# Patient Record
Sex: Female | Born: 1980 | Race: White | Hispanic: No | Marital: Married | Smoking: Former smoker
Health system: Southern US, Community
[De-identification: ages and names within clinical notes are randomized; demographics above are authoritative.]

## PROBLEM LIST (undated history)

## (undated) DIAGNOSIS — R51 Headache: Secondary | ICD-10-CM

## (undated) DIAGNOSIS — Z9889 Other specified postprocedural states: Secondary | ICD-10-CM

## (undated) DIAGNOSIS — H9190 Unspecified hearing loss, unspecified ear: Secondary | ICD-10-CM

## (undated) DIAGNOSIS — N924 Excessive bleeding in the premenopausal period: Secondary | ICD-10-CM

## (undated) DIAGNOSIS — E039 Hypothyroidism, unspecified: Secondary | ICD-10-CM

## (undated) DIAGNOSIS — R3989 Other symptoms and signs involving the genitourinary system: Secondary | ICD-10-CM

## (undated) DIAGNOSIS — O139 Gestational [pregnancy-induced] hypertension without significant proteinuria, unspecified trimester: Secondary | ICD-10-CM

## (undated) DIAGNOSIS — N943 Premenstrual tension syndrome: Secondary | ICD-10-CM

## (undated) DIAGNOSIS — R112 Nausea with vomiting, unspecified: Secondary | ICD-10-CM

## (undated) DIAGNOSIS — Z8659 Personal history of other mental and behavioral disorders: Secondary | ICD-10-CM

## (undated) DIAGNOSIS — J301 Allergic rhinitis due to pollen: Secondary | ICD-10-CM

## (undated) DIAGNOSIS — K8020 Calculus of gallbladder without cholecystitis without obstruction: Secondary | ICD-10-CM

## (undated) DIAGNOSIS — K921 Melena: Secondary | ICD-10-CM

## (undated) DIAGNOSIS — F411 Generalized anxiety disorder: Secondary | ICD-10-CM

## (undated) DIAGNOSIS — K219 Gastro-esophageal reflux disease without esophagitis: Secondary | ICD-10-CM

## (undated) HISTORY — PX: MOUTH SURGERY: SHX715

## (undated) HISTORY — DX: Morbid (severe) obesity due to excess calories: E66.01

## (undated) HISTORY — DX: Premenstrual tension syndrome: N94.3

## (undated) HISTORY — PX: THYROIDECTOMY: SHX17

## (undated) HISTORY — PX: KNEE ARTHROSCOPY W/ LASER: SHX1872

## (undated) HISTORY — DX: Hypothyroidism, unspecified: E03.9

## (undated) HISTORY — DX: Gastro-esophageal reflux disease without esophagitis: K21.9

## (undated) HISTORY — DX: Melena: K92.1

## (undated) HISTORY — DX: Personal history of other mental and behavioral disorders: Z86.59

## (undated) HISTORY — DX: Other symptoms and signs involving the genitourinary system: R39.89

## (undated) HISTORY — DX: Unspecified hearing loss, unspecified ear: H91.90

## (undated) HISTORY — DX: Gestational (pregnancy-induced) hypertension without significant proteinuria, unspecified trimester: O13.9

## (undated) HISTORY — DX: Excessive bleeding in the premenopausal period: N92.4

## (undated) HISTORY — DX: Allergic rhinitis due to pollen: J30.1

## (undated) HISTORY — DX: Generalized anxiety disorder: F41.1

## (undated) HISTORY — DX: Calculus of gallbladder without cholecystitis without obstruction: K80.20

---

## 1999-08-28 HISTORY — PX: CHOLECYSTECTOMY: SHX55

## 2005-05-02 ENCOUNTER — Encounter: Payer: Self-pay | Admitting: Family Medicine

## 2005-05-02 LAB — CONVERTED CEMR LAB

## 2006-03-08 ENCOUNTER — Ambulatory Visit: Payer: Self-pay | Admitting: Family Medicine

## 2006-03-13 ENCOUNTER — Ambulatory Visit: Payer: Self-pay | Admitting: Internal Medicine

## 2006-03-28 ENCOUNTER — Ambulatory Visit: Payer: Self-pay | Admitting: Family Medicine

## 2006-04-19 ENCOUNTER — Ambulatory Visit: Payer: Self-pay | Admitting: Family Medicine

## 2006-06-10 ENCOUNTER — Ambulatory Visit: Payer: Self-pay | Admitting: Family Medicine

## 2006-06-17 ENCOUNTER — Ambulatory Visit: Payer: Self-pay | Admitting: Family Medicine

## 2006-07-09 ENCOUNTER — Encounter: Payer: Self-pay | Admitting: Family Medicine

## 2006-07-10 ENCOUNTER — Ambulatory Visit: Payer: Self-pay | Admitting: Family Medicine

## 2006-08-08 ENCOUNTER — Ambulatory Visit: Payer: Self-pay | Admitting: Family Medicine

## 2006-08-08 DIAGNOSIS — N943 Premenstrual tension syndrome: Secondary | ICD-10-CM

## 2006-08-08 HISTORY — DX: Premenstrual tension syndrome: N94.3

## 2006-10-01 ENCOUNTER — Telehealth: Payer: Self-pay | Admitting: Family Medicine

## 2006-10-28 ENCOUNTER — Ambulatory Visit: Payer: Self-pay | Admitting: Family Medicine

## 2006-10-28 LAB — CONVERTED CEMR LAB: Beta hcg, urine, semiquantitative: NEGATIVE

## 2006-10-29 ENCOUNTER — Encounter: Payer: Self-pay | Admitting: Family Medicine

## 2006-10-30 ENCOUNTER — Telehealth: Payer: Self-pay | Admitting: Family Medicine

## 2006-12-20 ENCOUNTER — Ambulatory Visit: Payer: Self-pay | Admitting: Family Medicine

## 2006-12-20 DIAGNOSIS — F411 Generalized anxiety disorder: Secondary | ICD-10-CM

## 2006-12-20 HISTORY — DX: Generalized anxiety disorder: F41.1

## 2006-12-27 ENCOUNTER — Telehealth: Payer: Self-pay | Admitting: Family Medicine

## 2007-02-03 ENCOUNTER — Ambulatory Visit: Payer: Self-pay | Admitting: Family Medicine

## 2007-02-04 ENCOUNTER — Telehealth: Payer: Self-pay | Admitting: Family Medicine

## 2007-12-02 ENCOUNTER — Telehealth (INDEPENDENT_AMBULATORY_CARE_PROVIDER_SITE_OTHER): Payer: Self-pay | Admitting: *Deleted

## 2007-12-04 ENCOUNTER — Ambulatory Visit: Payer: Self-pay | Admitting: Family Medicine

## 2007-12-10 ENCOUNTER — Telehealth: Payer: Self-pay | Admitting: Family Medicine

## 2007-12-24 ENCOUNTER — Encounter: Payer: Self-pay | Admitting: Family Medicine

## 2008-06-03 ENCOUNTER — Emergency Department (HOSPITAL_COMMUNITY): Admission: EM | Admit: 2008-06-03 | Discharge: 2008-06-04 | Payer: Self-pay | Admitting: Emergency Medicine

## 2009-08-15 ENCOUNTER — Ambulatory Visit: Payer: Self-pay | Admitting: Occupational Medicine

## 2009-08-15 LAB — CONVERTED CEMR LAB: Rapid Strep: NEGATIVE

## 2009-09-04 ENCOUNTER — Ambulatory Visit: Payer: Self-pay | Admitting: Family Medicine

## 2009-09-07 ENCOUNTER — Telehealth: Payer: Self-pay | Admitting: Family Medicine

## 2010-08-27 NOTE — L&D Delivery Note (Signed)
Delivery Note At 7:31 PM a viable female was delivered via Vaginal, Spontaneous Delivery (Presentation: ;  ).  APGAR: 8, 9; weight 6 lb 15.1 oz (3150 g).   Placenta status: Intact, Spontaneous.  Cord: 3 vessels with the following complications: None.  Cord pH:   Anesthesia: Epidural  Episiotomy: None Lacerations: 1st degree;Perineal Suture Repair: 3.0 chromic Est. Blood Loss (mL): <500  Mom to postpartum.  Baby to nursery-stable.  CRESENZO-DISHMAN,Shantoya Geurts 04/21/2011, 8:13 PM

## 2010-09-15 ENCOUNTER — Ambulatory Visit
Admission: RE | Admit: 2010-09-15 | Discharge: 2010-09-15 | Payer: Self-pay | Source: Home / Self Care | Attending: Physician Assistant | Admitting: Physician Assistant

## 2010-09-17 ENCOUNTER — Encounter: Payer: Self-pay | Admitting: Emergency Medicine

## 2010-09-26 NOTE — Assessment & Plan Note (Signed)
Summary: HOARSENESS - x 3-4 wks  proced rm   Vital Signs:  Patient Profile:   30 Years Old Female CC:      Cold & URI symptoms Height:     61 inches Weight:      224 pounds O2 Sat:      99 % O2 treatment:    Room Air Temp:     97.7 degrees F oral Pulse rate:   70 / minute Pulse rhythm:   regular Resp:     16 per minute BP sitting:   118 / 73  (right arm) Cuff size:   regular  Vitals Entered By: Areta Haber CMA (September 04, 2009 2:54 PM)                Pt's spouse clld in and LMOVM @ 10:50am requesting antibiotics be clld in for wife. Spouse states that she is now running a fever and stated that physician said he would cll in prescription. Areta Haber CMA  September 06, 2009 4:20 PM   Current Allergies: ! PCN   History of Present Illness Chief Complaint: Cold & URI symptoms History of Present Illness: Subjective:  Patient complains of hoarseness for several weeks, without other symptoms.  She denies sore throat, fever, nasal congestion, or post-nasal drainage.  No cough.  She does have a history of GERD for which she takes Zantac, and feels that it is well controlled.  She also has a history of a right thyroid nodule which has not changed recently. She does smoke.  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  Take PC  REVIEW OF SYSTEMS Constitutional Symptoms      Denies fever, chills, night sweats, weight loss, weight  gain, and fatigue.  Eyes       Denies change in vision, eye pain, eye discharge, glasses, contact lenses, and eye surgery. Ear/Nose/Throat/Mouth       Complains of hoarseness.      Denies hearing loss/aids, change in hearing, ear pain, ear discharge, dizziness, frequent runny nose, frequent nose bleeds, sinus problems, sore throat, and tooth pain or bleeding.  Respiratory       Complains of shortness of breath.      Denies dry cough, productive cough, wheezing, asthma, bronchitis, and emphysema/COPD.      Comments: chest congestion Cardiovascular       Denies murmurs, chest pain, and tires easily with exhertion.    Gastrointestinal       Denies stomach pain, nausea/vomiting, diarrhea, constipation, blood in bowel movements, and indigestion. Genitourniary       Denies painful urination, kidney stones, and loss of urinary control. Neurological       Complains of headaches.      Denies paralysis, seizures, and fainting/blackouts. Musculoskeletal       Denies muscle pain, joint pain, joint stiffness, decreased range of motion, redness, swelling, muscle weakness, and gout.  Skin       Denies bruising, unusual mles/lumps or sores, and hair/skin or nail changes.  Psych       Denies mood changes, temper/anger issues,  anxiety/stress, speech problems, depression, and sleep problems. Other Comments: Pt has not seen PCP. 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  Lives with parents and daughter.  Smokes 1/2ppd for 2 years.  No EtOH, no drugs, no caffeine.  Walking for 60 minutes daily.  Risk Factors: Smoking Status: current (07/10/2006) Packs/Day: 1/2  (07/10/2006)   Objective:  No acute distress  Eyes:  Pupils are equal, round, and reactive to light and accomdation.  Extraocular movement is intact.  Conjunctivae are not inflamed.  Ears:  Canals normal.  Tympanic membranes normal.   Nose:  Normal septum.  Normal turbinates, mildly congested.    No sinus tenderness present.  Pharynx:  Normal  Neck:  Supple.  No adenopathy is present.  There is fullness of the right thyroid with mild tenderness. Assessment New Problems: HOARSENESS (ZOX-096.04)  HOARSENESS ? ETIOLOGY.  No evidence infection on exam  Plan New Medications/Changes: PREDNISONE 10 MG TABS (PREDNISONE) 2 PO BID for 2 days, then 1 BID for 2 days, then 1 daily for 2 days.  Take PC  #14 x 0, 09/04/2009, Donna Christen MD  New Orders: Est. Patient Level III [phone] Planning Comments:   Begin tapering course of prednisone.  Call if fever develops (consider beginning antibiotic) Recommend discontinuing smoking. Follow-up with ENT if not improving one week.   The patient and/or caregiver has been counseled thoroughly with regard to medications prescribed including dosage, schedule, interactions, rationale for use, and possible side effects and they verbalize understanding.  Diagnoses and expected course of recovery discussed and will return if not improved as expected or if the condition worsens. Patient and/or caregiver verbalized understanding.  Prescriptions: PREDNISONE 10 MG TABS (PREDNISONE) 2 PO BID for 2 days, then 1 BID for 2 days, then 1 daily for 2 days.  Take PC  #14 x 0   Entered and Authorized by:   Donna Christen MD   Signed by:   Donna Christen MD on 09/04/2009   Method used:   Print then Give to Patient   RxID:   [id number]

## 2010-09-26 NOTE — Progress Notes (Signed)
  Phone Note Call from Patient   Caller: Spouse Summary of Call: Pt's spouse called in requesting antibiotics be called in for wife.  Spouse states that she is running a fever and stated physician said he  would call in prescription.  Pharmacy [facility] [phone].  Patient spouse [name] [phone]. Initial call taken by: Junius Finner,  September 07, 2009 9:53 AM    Z-pack may be called in per MD note.   Appended Document:  Clld in Z-Pck per physician orders. Spoke to Matt/Pharmacist @ [facility] pharmacy/K[facility] [id number].

## 2010-10-10 ENCOUNTER — Encounter: Payer: Medicaid Other | Admitting: Obstetrics & Gynecology

## 2010-10-10 ENCOUNTER — Other Ambulatory Visit: Payer: Self-pay | Admitting: Obstetrics & Gynecology

## 2010-10-10 DIAGNOSIS — Z3480 Encounter for supervision of other normal pregnancy, unspecified trimester: Secondary | ICD-10-CM

## 2010-10-10 DIAGNOSIS — IMO0002 Reserved for concepts with insufficient information to code with codable children: Secondary | ICD-10-CM

## 2010-10-10 DIAGNOSIS — Z3682 Encounter for antenatal screening for nuchal translucency: Secondary | ICD-10-CM

## 2010-10-10 LAB — GC/CHLAMYDIA PROBE AMP, GENITAL
Chlamydia: NEGATIVE
Gonorrhea: NEGATIVE

## 2010-10-10 LAB — HEPATITIS B SURFACE ANTIGEN: Hepatitis B Surface Ag: NEGATIVE

## 2010-10-10 LAB — ABO/RH: RH Type: POSITIVE

## 2010-10-10 LAB — RUBELLA ANTIBODY, IGM: Rubella: IMMUNE

## 2010-10-10 LAB — ANTIBODY SCREEN: Antibody Screen: NEGATIVE

## 2010-10-10 LAB — SYPHILIS: RPR W/REFLEX TO RPR TITER AND TREPONEMAL ANTIBODIES, TRADITIONAL SCREENING AND DIAGNOSIS ALGORITHM: RPR: NONREACTIVE

## 2010-10-10 LAB — HIV ANTIBODY (ROUTINE TESTING W REFLEX): HIV: NONREACTIVE

## 2010-10-11 ENCOUNTER — Encounter: Payer: Self-pay | Admitting: Obstetrics & Gynecology

## 2010-10-17 ENCOUNTER — Encounter: Payer: Medicaid Other | Admitting: Obstetrics & Gynecology

## 2010-10-17 DIAGNOSIS — Z3480 Encounter for supervision of other normal pregnancy, unspecified trimester: Secondary | ICD-10-CM

## 2010-10-19 ENCOUNTER — Other Ambulatory Visit: Payer: Self-pay | Admitting: Obstetrics & Gynecology

## 2010-10-19 ENCOUNTER — Encounter (HOSPITAL_COMMUNITY): Payer: Self-pay

## 2010-10-19 ENCOUNTER — Ambulatory Visit (HOSPITAL_COMMUNITY)
Admission: RE | Admit: 2010-10-19 | Discharge: 2010-10-19 | Disposition: A | Discharge status: Home / Self Care | Payer: Medicaid Other | Source: Ambulatory Visit | Attending: Obstetrics & Gynecology | Admitting: Obstetrics & Gynecology

## 2010-10-19 DIAGNOSIS — O3510X0 Maternal care for (suspected) chromosomal abnormality in fetus, unspecified, not applicable or unspecified: Secondary | ICD-10-CM | POA: Insufficient documentation

## 2010-10-19 DIAGNOSIS — Z3689 Encounter for other specified antenatal screening: Secondary | ICD-10-CM | POA: Insufficient documentation

## 2010-10-19 DIAGNOSIS — O351XX0 Maternal care for (suspected) chromosomal abnormality in fetus, not applicable or unspecified: Secondary | ICD-10-CM | POA: Insufficient documentation

## 2010-10-19 DIAGNOSIS — Z3682 Encounter for antenatal screening for nuchal translucency: Secondary | ICD-10-CM

## 2010-11-06 DIAGNOSIS — R1031 Right lower quadrant pain: Secondary | ICD-10-CM

## 2010-11-07 ENCOUNTER — Ambulatory Visit (HOSPITAL_COMMUNITY): Payer: Medicaid Other

## 2010-11-14 ENCOUNTER — Encounter: Payer: Medicaid Other | Admitting: Obstetrics & Gynecology

## 2010-11-14 ENCOUNTER — Other Ambulatory Visit: Payer: Self-pay | Admitting: Family Medicine

## 2010-11-14 DIAGNOSIS — Z3400 Encounter for supervision of normal first pregnancy, unspecified trimester: Secondary | ICD-10-CM

## 2010-11-14 DIAGNOSIS — Z3689 Encounter for other specified antenatal screening: Secondary | ICD-10-CM

## 2010-11-15 ENCOUNTER — Encounter: Payer: Self-pay | Admitting: Obstetrics & Gynecology

## 2010-11-28 ENCOUNTER — Ambulatory Visit (HOSPITAL_COMMUNITY)
Admission: RE | Admit: 2010-11-28 | Discharge: 2010-11-28 | Disposition: A | Discharge status: Home / Self Care | Payer: Medicaid Other | Source: Ambulatory Visit | Attending: Family Medicine | Admitting: Family Medicine

## 2010-11-28 DIAGNOSIS — Z363 Encounter for antenatal screening for malformations: Secondary | ICD-10-CM | POA: Insufficient documentation

## 2010-11-28 DIAGNOSIS — Z1389 Encounter for screening for other disorder: Secondary | ICD-10-CM | POA: Insufficient documentation

## 2010-11-28 DIAGNOSIS — O358XX0 Maternal care for other (suspected) fetal abnormality and damage, not applicable or unspecified: Secondary | ICD-10-CM | POA: Insufficient documentation

## 2010-11-28 DIAGNOSIS — Z3689 Encounter for other specified antenatal screening: Secondary | ICD-10-CM

## 2010-12-04 ENCOUNTER — Inpatient Hospital Stay (HOSPITAL_COMMUNITY)
Admission: AD | Admit: 2010-12-04 | Discharge: 2010-12-04 | Disposition: A | Discharge status: Home / Self Care | Payer: Medicaid Other | Source: Ambulatory Visit | Attending: Family Medicine | Admitting: Family Medicine

## 2010-12-04 DIAGNOSIS — N760 Acute vaginitis: Secondary | ICD-10-CM | POA: Insufficient documentation

## 2010-12-04 DIAGNOSIS — O2390 Unspecified genitourinary tract infection in pregnancy, unspecified trimester: Secondary | ICD-10-CM | POA: Insufficient documentation

## 2010-12-04 DIAGNOSIS — N949 Unspecified condition associated with female genital organs and menstrual cycle: Secondary | ICD-10-CM | POA: Insufficient documentation

## 2010-12-04 LAB — URINALYSIS, ROUTINE W REFLEX MICROSCOPIC
Bilirubin Urine: NEGATIVE
Glucose, UA: NEGATIVE mg/dL
Hgb urine dipstick: NEGATIVE
Ketones, ur: NEGATIVE mg/dL
Specific Gravity, Urine: <1.005 — ABNORMAL LOW (ref 1.005–1.030)
pH: 5.5 (ref 5.0–8.0)

## 2010-12-04 LAB — WET PREP, GENITAL
Clue Cells Wet Prep HPF POC: NONE SEEN
Trich, Wet Prep: NONE SEEN

## 2010-12-12 ENCOUNTER — Other Ambulatory Visit: Payer: Self-pay | Admitting: Family Medicine

## 2010-12-12 ENCOUNTER — Encounter: Payer: Medicaid Other | Admitting: Obstetrics & Gynecology

## 2010-12-12 DIAGNOSIS — IMO0002 Reserved for concepts with insufficient information to code with codable children: Secondary | ICD-10-CM

## 2010-12-12 DIAGNOSIS — Z3689 Encounter for other specified antenatal screening: Secondary | ICD-10-CM

## 2010-12-19 ENCOUNTER — Ambulatory Visit (HOSPITAL_COMMUNITY)
Admission: RE | Admit: 2010-12-19 | Discharge: 2010-12-19 | Disposition: A | Discharge status: Home / Self Care | Payer: Medicaid Other | Source: Ambulatory Visit | Attending: Family Medicine | Admitting: Family Medicine

## 2010-12-19 DIAGNOSIS — Z3689 Encounter for other specified antenatal screening: Secondary | ICD-10-CM | POA: Insufficient documentation

## 2010-12-19 DIAGNOSIS — O09219 Supervision of pregnancy with history of pre-term labor, unspecified trimester: Secondary | ICD-10-CM | POA: Insufficient documentation

## 2011-01-09 ENCOUNTER — Encounter (INDEPENDENT_AMBULATORY_CARE_PROVIDER_SITE_OTHER): Payer: Medicaid Other | Admitting: Obstetrics & Gynecology

## 2011-01-09 DIAGNOSIS — Z3480 Encounter for supervision of other normal pregnancy, unspecified trimester: Secondary | ICD-10-CM

## 2011-01-09 DIAGNOSIS — E669 Obesity, unspecified: Secondary | ICD-10-CM

## 2011-01-12 NOTE — Assessment & Plan Note (Signed)
[facility]                           GASTROENTEROLOGY OFFICE NOTE   Desiree Jackson, Desiree Jackson                  MRN:          [id number]  DATE:03/13/2006                            DOB:          June 29, 1981    REQUESTING PHYSICIAN:  Nani Gasser, M.D.   REASON FOR CONSULTATION:  Blood in stool.   ASSESSMENT:  A 30 year old white woman with recent weight gain, chronic  diarrhea, chronic abdominal pain and rectal bleeding as well as prior heme-  positive stool.  She also has fairly frequent heartburn incompletely  relieved by Zantac, though she had told Dr. Linford Arnold it worked well.  In  addition, there is a family history of colon cancer in her grandmother at  age 32 and a maternal cousin (same side as grandmother) has had a colostomy  and a reversal at the age of 41 and 64 subsequently.  The reason for that is  not entirely clear.   RECOMMENDATIONS AND PLAN:  1.  Levsin SL one to two every four hours or a.c. p.r.n.  2.  Consider cholestyramine pending colonoscopy as she may have post      cholecystectomy diarrhea problems.  3.  Prilosec OTC 20 mg daily instead of the Zantac.  4.  Further plans pending the above.  I have explained the risks, benefits      and indications of colonoscopy which will be scheduled to evaluate for      inflammatory bowel disease and because of the family history of colon      cancer.  I suspect she does have irritable bowel syndrome and/or rectal      bleeding (I did see hemorrhoids on anoscopy today), but there are enough      problems here and family issues to push towards a colonoscopy.   HISTORY:  A 30 year old white woman with problems as above.  She has fairly  frequent heartburn.  She uses at least one Zantac a day and it is hard to  tell, but it sounds like there is incomplete relief.  There is no dysphagia  or hematemesis or weight loss; in fact, she has gained 50 pounds in the last  year.  She had  postprandial diarrhea with rare nocturnal stools.  This has  been a problem since cholecystectomy a number of years ago.  She has very  crampy abdominal pain associated with this, and she has been passing blood  into the water, usually bright red but sometimes maroon, off and on for a  year or so.  She denies fever associated with this except for years ago she  had some fever at one point.  She recently had a fever related to a cold she  says.  Recent hemoglobin was 14.9 per the office notes furnished by Dr.  Linford Arnold.  Her thyroid panel was apparently normal, she had a TSH, T4 and  T3, and she has a thyroid nodule with an ultrasound that was performed; the  results are pending.   MEDICATIONS:  Only the Zantac at this point.  She has used some Advil for  headaches occasionally.   PAST MEDICAL  HISTORY:  1.  Some chronic headaches.  2.  Allergies and sinus problems.  3.  thyroid nodule.  Previous ultrasound of this with biopsy was      inconclusive she tells me.  4.  Cholecystectomy  5.  Childbirth.   FAMILY HISTORY:  As above.  There is also second-degree relatives with  uterine cancer and breast cancer and ovarian cancer.  These are maternal  aunts, not clarified further.  Father had heart disease.   SOCIAL HISTORY:  She is currently separated and living with her parents.  She has a 37-year-old daughter. She stays at home.  She smokes four  cigarettes a day.  She is counseled to quit.  She has quit before.  She does  not use caffeine.   REVIEW OF SYSTEMS:  As above.  Some pain with periods.  Periods are heavy  somewhat.  She has back pain, insomnia, allergy problems, and she either  uses eye glasses or contact lenses.   PHYSICAL EXAMINATION:  Obese, young, white woman with a slightly flat  affect.  Height 5 feet 1-3/4 inches.  Weight 203 pounds.  Blood pressure  116/76.  Pulse 83.  EYES:  Anicteric.  ENT:  Normal mouth, lips, pharynx.  NECK:  There is a nodule in the right  lower lobe of the thyroid.  CHEST:  Clear.  HEART:  S1 S2, no murmurs or gallops.  ABDOMEN:  Obese.  There are some striae.  She has some diffuse lower  abdominal tenderness.  There is no organomegaly or mass.  RECTAL EXAM:  In the presence of Desiree Jackson, MS1, shows brown stool, no  mass.  There is a small external hemorrhoid.  An anoscopy is performed which  shows some hemorrhoids in the canal.  They are not particularly inflamed.  LYMPHATIC:  No neck or supraclavicular nodes.  EXTREMITIES:  No edema.  SKIN:  No acute rash.  She has striae on the abdomen.  PSYCHIATRIC:  She is alert and oriented times three.   I appreciate the opportunity to care for this patient.   Note, the family history of uterine cancer and breast cancer does raise a  question of Lynch syndrome issues.  Though these are in second-degree  relatives, it is something to note as well.                                   Iva Boop, MD, [facility]   CEG/MedQ  DD:  03/13/2006  DT:  03/13/2006  Job #:  [id number]   cc:   Nani Gasser, MD

## 2011-01-12 NOTE — Letter (Signed)
March 13, 2006     Nani Gasser, MD  [phone]  [place], [place] [address]   RE:  JARVIS, KNODEL  MRN:  [id number]  /  DOB:  18-Sep-1980   Dear Santina Evans:   Thanks for your kind referral of Ms. Risk.  As you will see in my  consultation note I think she probably has irritable bowel syndrome and  anorectal bleeding but there is some family history issues and the  chronicity of her symptoms warrant a colonoscopy I think.  Mainly because of  the family history.  I have put her on Prilosec and Levsin and we will  discuss further plans pending her colonoscopy.   Once again, thanks for letting me help care for your patient's.    Sincerely,      Iva Boop, MD, [facility]   CEG/MedQ  DD:  03/13/2006  DT:  03/13/2006  Job #:  [id number]

## 2011-01-16 ENCOUNTER — Encounter (INDEPENDENT_AMBULATORY_CARE_PROVIDER_SITE_OTHER): Payer: Medicaid Other | Admitting: Obstetrics & Gynecology

## 2011-01-16 DIAGNOSIS — Z3480 Encounter for supervision of other normal pregnancy, unspecified trimester: Secondary | ICD-10-CM

## 2011-01-30 ENCOUNTER — Other Ambulatory Visit: Payer: Self-pay | Admitting: Obstetrics & Gynecology

## 2011-01-30 ENCOUNTER — Encounter (INDEPENDENT_AMBULATORY_CARE_PROVIDER_SITE_OTHER): Payer: Medicaid Other | Admitting: Obstetrics & Gynecology

## 2011-01-30 DIAGNOSIS — O3660X0 Maternal care for excessive fetal growth, unspecified trimester, not applicable or unspecified: Secondary | ICD-10-CM

## 2011-01-30 DIAGNOSIS — Z3480 Encounter for supervision of other normal pregnancy, unspecified trimester: Secondary | ICD-10-CM

## 2011-01-30 DIAGNOSIS — J069 Acute upper respiratory infection, unspecified: Secondary | ICD-10-CM

## 2011-01-31 ENCOUNTER — Ambulatory Visit (HOSPITAL_COMMUNITY)
Admission: RE | Admit: 2011-01-31 | Discharge: 2011-01-31 | Disposition: A | Discharge status: Home / Self Care | Payer: Medicaid Other | Source: Ambulatory Visit | Attending: Obstetrics & Gynecology | Admitting: Obstetrics & Gynecology

## 2011-01-31 DIAGNOSIS — Z3689 Encounter for other specified antenatal screening: Secondary | ICD-10-CM | POA: Insufficient documentation

## 2011-01-31 DIAGNOSIS — O3660X0 Maternal care for excessive fetal growth, unspecified trimester, not applicable or unspecified: Secondary | ICD-10-CM

## 2011-02-12 ENCOUNTER — Encounter (INDEPENDENT_AMBULATORY_CARE_PROVIDER_SITE_OTHER): Payer: Medicaid Other

## 2011-02-12 DIAGNOSIS — Z3480 Encounter for supervision of other normal pregnancy, unspecified trimester: Secondary | ICD-10-CM

## 2011-02-26 ENCOUNTER — Encounter (INDEPENDENT_AMBULATORY_CARE_PROVIDER_SITE_OTHER): Payer: Medicaid Other

## 2011-02-26 DIAGNOSIS — Z3480 Encounter for supervision of other normal pregnancy, unspecified trimester: Secondary | ICD-10-CM

## 2011-03-05 ENCOUNTER — Ambulatory Visit (INDEPENDENT_AMBULATORY_CARE_PROVIDER_SITE_OTHER): Payer: Medicaid Other | Admitting: Physician Assistant

## 2011-03-05 VITALS — BP 136/82 | Wt 227.0 lb

## 2011-03-05 DIAGNOSIS — R519 Headache, unspecified: Secondary | ICD-10-CM | POA: Insufficient documentation

## 2011-03-05 DIAGNOSIS — IMO0002 Reserved for concepts with insufficient information to code with codable children: Secondary | ICD-10-CM

## 2011-03-05 DIAGNOSIS — O26899 Other specified pregnancy related conditions, unspecified trimester: Secondary | ICD-10-CM | POA: Insufficient documentation

## 2011-03-05 DIAGNOSIS — Z3480 Encounter for supervision of other normal pregnancy, unspecified trimester: Secondary | ICD-10-CM

## 2011-03-05 NOTE — Progress Notes (Signed)
P-107  Diarrhea on/off x 1 week

## 2011-03-05 NOTE — Progress Notes (Signed)
  Subjective:    Desiree Jackson is a 30 y.o. female being seen today for her obstetrical visit. She is at [redacted]w[redacted]d gestation. Patient reports headache. Fetal movement: normal.  Menstrual History: OB History    Grav Para Term Preterm Abortions TAB SAB Ect Mult Living   1               Menarche age: 63 Patient's last menstrual period was 07/20/2010.    The following portions of the patient's history were reviewed and updated as appropriate: past medical history, past social history, past surgical history and problem list.  Review of Systems C/o daily right frontal HA w/o visual disturbance. States husband took BP over weekend and 3 separate BPs were >140/90  Objective:    BP 136/82  Wt 102.967 kg (227 lb)  LMP 07/20/2010 FHT:  132 BPM  Uterine Size: 34 cm  Presentation: unsure     Assessment:    Pregnancy 32 and 4/7 weeks   Plan:  Baseline Pre x labs today with 24 hour urine Rx Vicodin for HA FU in 2 weeks, RTC sooner if HA persists   28-week labs reviewed, normal  Follow up in 2 Weeks.

## 2011-03-05 NOTE — Patient Instructions (Signed)
Hypertension In Pregnancy  High Blood Pressure in Pregnancy  You can develop high blood pressure (hypertension) in the last half of your pregnancy (20 weeks of gestation or later). This can happen even if your blood pressure was normal before pregnancy. This condition is usually monitored closely because pregnancy-induced hypertension (PIH) can be associated with more serious problems. PIH can be a sign of a complication of pregnancy called preeclampsia. This is especially true if you have protein in your urine. If preeclampsia progresses, it can cause convulsions during your pregnancy (eclampsia). It can also become HELLP (hemolysis, elevated liver enzymes, and low platelet count) syndrome, which can lead to abnormalities of the liver, abnormalities of blood clotting, increased high blood pressure, and increased protein in the urine. Both conditions are very serious and can threaten the life of a mother and her baby. A woman who has high blood pressure before getting pregnant can also get superimposed preeclampsia, which is usually a more severe form of preeclampsia.  HOME CARE INSTRUCTIONS   Make sure you understand these instructions and ask your caregiver any questions you have.    Follow your caregiver's instructions carefully.    Take medications as directed by your caregiver.   SEEK IMMEDIATE MEDICAL CARE IF YOU HAVE:   Stomach pain.    Sudden and excessive swelling in the hands, ankles, or face.    Weight gain of 4 pounds (1.8 kg) or more within 1 week.    Repeated vomiting.    Vaginal bleeding.    A lack of fetal movement.    Headache.    Vision problems (blurred or double vision).    Muscle twitching or spasm.    Shortness of breath.    Blue fingernails and lips.    Blood in the urine.   Document Released: 08/13/2005 Document Re-Released: 01/31/2010  Exit[employer] Patient Information 2011 [employer].

## 2011-03-06 LAB — COMPREHENSIVE METABOLIC PANEL
ALT: 11 U/L (ref 0–35)
Albumin: 3.4 g/dL — ABNORMAL LOW (ref 3.5–5.2)
CO2: 25 mEq/L (ref 19–32)
Chloride: 104 mEq/L (ref 96–112)
Glucose, Bld: 87 mg/dL (ref 70–99)
Potassium: 3.9 mEq/L (ref 3.5–5.3)
Sodium: 135 mEq/L (ref 135–145)
Total Bilirubin: 0.3 mg/dL (ref 0.3–1.2)
Total Protein: 6 g/dL (ref 6.0–8.3)

## 2011-03-06 LAB — CBC
Hemoglobin: 11.7 g/dL — ABNORMAL LOW (ref 12.0–15.0)
MCH: 28.7 pg (ref 26.0–34.0)
Platelets: 276 10*3/uL (ref 150–400)
RBC: 4.08 MIL/uL (ref 3.87–5.11)
WBC: 12.5 10*3/uL — ABNORMAL HIGH (ref 4.0–10.5)

## 2011-03-07 ENCOUNTER — Other Ambulatory Visit: Payer: Self-pay | Admitting: Physician Assistant

## 2011-03-08 LAB — PROTEIN, URINE, 24 HOUR: Protein, 24H Urine: 102 mg/d — ABNORMAL HIGH (ref 50–100)

## 2011-03-14 ENCOUNTER — Encounter: Payer: Medicaid Other | Admitting: Obstetrics & Gynecology

## 2011-03-20 ENCOUNTER — Encounter (INDEPENDENT_AMBULATORY_CARE_PROVIDER_SITE_OTHER): Payer: Medicaid Other | Admitting: Obstetrics & Gynecology

## 2011-03-20 DIAGNOSIS — Z3480 Encounter for supervision of other normal pregnancy, unspecified trimester: Secondary | ICD-10-CM

## 2011-04-02 ENCOUNTER — Encounter (INDEPENDENT_AMBULATORY_CARE_PROVIDER_SITE_OTHER): Payer: Medicaid Other | Admitting: Physician Assistant

## 2011-04-02 ENCOUNTER — Other Ambulatory Visit: Payer: Self-pay | Admitting: Physician Assistant

## 2011-04-02 DIAGNOSIS — IMO0002 Reserved for concepts with insufficient information to code with codable children: Secondary | ICD-10-CM

## 2011-04-02 DIAGNOSIS — Z3480 Encounter for supervision of other normal pregnancy, unspecified trimester: Secondary | ICD-10-CM

## 2011-04-03 LAB — CBC
Hemoglobin: 12.1 g/dL (ref 12.0–15.0)
MCH: 28 pg (ref 26.0–34.0)
MCHC: 32.5 g/dL (ref 30.0–36.0)
MCV: 86.1 fL (ref 78.0–100.0)
Platelets: 284 10*3/uL (ref 150–400)
RBC: 4.32 MIL/uL (ref 3.87–5.11)

## 2011-04-03 LAB — PROTEIN / CREATININE RATIO, URINE
Creatinine, Urine: 107.3 mg/dL
Total Protein, Urine: 5 mg/dL

## 2011-04-03 LAB — COMPREHENSIVE METABOLIC PANEL
Alkaline Phosphatase: 141 U/L — ABNORMAL HIGH (ref 39–117)
CO2: 22 mEq/L (ref 19–32)
Creat: 0.62 mg/dL (ref 0.50–1.10)
Glucose, Bld: 67 mg/dL — ABNORMAL LOW (ref 70–99)
Total Bilirubin: 0.3 mg/dL (ref 0.3–1.2)

## 2011-04-04 LAB — STREP B DNA PROBE: GBSP: NEGATIVE

## 2011-04-09 ENCOUNTER — Encounter (INDEPENDENT_AMBULATORY_CARE_PROVIDER_SITE_OTHER): Payer: Medicaid Other | Admitting: Advanced Practice Midwife

## 2011-04-09 DIAGNOSIS — Z3480 Encounter for supervision of other normal pregnancy, unspecified trimester: Secondary | ICD-10-CM

## 2011-04-11 ENCOUNTER — Encounter (INDEPENDENT_AMBULATORY_CARE_PROVIDER_SITE_OTHER): Payer: Medicaid Other | Admitting: Obstetrics & Gynecology

## 2011-04-11 ENCOUNTER — Other Ambulatory Visit: Payer: Self-pay | Admitting: Advanced Practice Midwife

## 2011-04-11 DIAGNOSIS — O169 Unspecified maternal hypertension, unspecified trimester: Secondary | ICD-10-CM

## 2011-04-11 DIAGNOSIS — Z3480 Encounter for supervision of other normal pregnancy, unspecified trimester: Secondary | ICD-10-CM

## 2011-04-13 ENCOUNTER — Inpatient Hospital Stay (HOSPITAL_COMMUNITY): Payer: Medicaid Other

## 2011-04-13 ENCOUNTER — Encounter (INDEPENDENT_AMBULATORY_CARE_PROVIDER_SITE_OTHER): Payer: Medicaid Other | Admitting: Family

## 2011-04-13 ENCOUNTER — Encounter (HOSPITAL_COMMUNITY): Payer: Self-pay

## 2011-04-13 ENCOUNTER — Inpatient Hospital Stay (HOSPITAL_COMMUNITY)
Admission: AD | Admit: 2011-04-13 | Discharge: 2011-04-13 | Disposition: A | Discharge status: Home / Self Care | Payer: Medicaid Other | Source: Ambulatory Visit | Attending: Obstetrics & Gynecology | Admitting: Obstetrics & Gynecology

## 2011-04-13 DIAGNOSIS — O26899 Other specified pregnancy related conditions, unspecified trimester: Secondary | ICD-10-CM

## 2011-04-13 DIAGNOSIS — IMO0002 Reserved for concepts with insufficient information to code with codable children: Secondary | ICD-10-CM

## 2011-04-13 DIAGNOSIS — O139 Gestational [pregnancy-induced] hypertension without significant proteinuria, unspecified trimester: Secondary | ICD-10-CM | POA: Insufficient documentation

## 2011-04-13 DIAGNOSIS — R519 Headache, unspecified: Secondary | ICD-10-CM

## 2011-04-13 DIAGNOSIS — R51 Headache: Secondary | ICD-10-CM

## 2011-04-13 DIAGNOSIS — Z3480 Encounter for supervision of other normal pregnancy, unspecified trimester: Secondary | ICD-10-CM

## 2011-04-13 DIAGNOSIS — O368190 Decreased fetal movements, unspecified trimester, not applicable or unspecified: Secondary | ICD-10-CM | POA: Insufficient documentation

## 2011-04-13 LAB — COMPREHENSIVE METABOLIC PANEL
ALT: 10 U/L (ref 0–35)
AST: 16 U/L (ref 0–37)
Albumin: 2.8 g/dL — ABNORMAL LOW (ref 3.5–5.2)
Alkaline Phosphatase: 153 U/L — ABNORMAL HIGH (ref 39–117)
Chloride: 102 mEq/L (ref 96–112)
Potassium: 4.1 mEq/L (ref 3.5–5.1)
Sodium: 134 mEq/L — ABNORMAL LOW (ref 135–145)
Total Bilirubin: 0.4 mg/dL (ref 0.3–1.2)

## 2011-04-13 LAB — CBC
Platelets: 275 10*3/uL (ref 150–400)
RDW: 14.3 % (ref 11.5–15.5)
WBC: 14.5 10*3/uL — ABNORMAL HIGH (ref 4.0–10.5)

## 2011-04-13 LAB — PROTEIN / CREATININE RATIO, URINE: Total Protein, Urine: 4.2 mg/dL

## 2011-04-13 NOTE — ED Provider Notes (Signed)
Desiree Jackson is a 30 y.o. [id number] at @38 .1 wks Chief Complaint  Patient presents with  . Hypertension   Subjective Sent from routine PNV this am for further evaluation due to borderline BP elevations and report of decreased FM. Having intermittent mild headaches and blurred vision. Slight bloody show x3 days. Cx has been 3-4 cm dilated. UCs irregular and mild but persistent. No epigastric pain or LOF.   Objective  Filed Vitals:   04/13/11 1032  BP: 138/85  Pulse: 84  Temp:   Resp:    Serial BPs 130's/80's with isolated 140/93. FHR 145, reactive no decels UCs q 4-6 min x 40-6 sec mild Cx soft 3-4/70/-3 vtx, slight show; membranes swept  Results for orders placed during the hospital encounter of 04/13/11 (from the past 24 hour(s))  PROTEIN / CREATININE RATIO, URINE     Status: Normal   Collection Time   04/13/11 10:18 AM      Component Value Range   Creatinine, Urine 35.49     Total Protein, Urine 4.2     PROTEIN CREATININE RATIO 0.12  0.00 - 0.15   CBC     Status: Abnormal   Collection Time   04/13/11 10:20 AM      Component Value Range   WBC 14.5 (*) 4.0 - 10.5 (K/uL)   RBC 4.27  3.87 - 5.11 (MIL/uL)   Hemoglobin 12.2  12.0 - 15.0 (g/dL)   HCT 13.0  86.5 - 78.4 (%)   MCV 84.5  78.0 - 100.0 (fL)   MCH 28.6  26.0 - 34.0 (pg)   MCHC 33.8  30.0 - 36.0 (g/dL)   RDW 69.6  29.5 - 28.4 (%)   Platelets 275  150 - 400 (K/uL)  COMPREHENSIVE METABOLIC PANEL     Status: Abnormal   Collection Time   04/13/11 10:20 AM      Component Value Range   Sodium 134 (*) 135 - 145 (mEq/L)   Potassium 4.1  3.5 - 5.1 (mEq/L)   Chloride 102  96 - 112 (mEq/L)   CO2 24  19 - 32 (mEq/L)   Glucose, Bld 79  70 - 99 (mg/dL)   BUN 6  6 - 23 (mg/dL)   Creatinine, Ser 1.32  0.50 - 1.10 (mg/dL)   Calcium 9.3  8.4 - 44.0 (mg/dL)   Total Protein 6.5  6.0 - 8.3 (g/dL)   Albumin 2.8 (*) 3.5 - 5.2 (g/dL)   AST 16  0 - 37 (U/L)   ALT 10  0 - 35 (U/L)   Alkaline Phosphatase 153 (*) 39 - 117 (U/L)    Total Bilirubin 0.4  0.3 - 1.2 (mg/dL)   GFR calc non Af Amer >60  >60 (mL/min)   GFR calc Af Amer >60  >60 (mL/min)   BPP 10/10 and AFI 12.88  Assessment Borderline GHTN with no evidence preE. Cx favorable and fetal parameters reasssuring  Plan Discussed with Dr. Debroah Loop. Home with labor and preE precautions. Rest. F/U 3 days at [facility]

## 2011-04-13 NOTE — Progress Notes (Signed)
Pt sent from the clinic for evaluation of elevate BP. Pt does states she is having contractions about 10 minutes, a little spotting and headaches. Was 3-4 2 days ago.

## 2011-04-13 NOTE — ED Notes (Signed)
D. Poe CNM at bedside  

## 2011-04-16 ENCOUNTER — Encounter (INDEPENDENT_AMBULATORY_CARE_PROVIDER_SITE_OTHER): Payer: Medicaid Other

## 2011-04-16 DIAGNOSIS — Z3480 Encounter for supervision of other normal pregnancy, unspecified trimester: Secondary | ICD-10-CM

## 2011-04-16 DIAGNOSIS — O169 Unspecified maternal hypertension, unspecified trimester: Secondary | ICD-10-CM

## 2011-04-18 ENCOUNTER — Encounter (HOSPITAL_COMMUNITY): Payer: Self-pay | Admitting: *Deleted

## 2011-04-18 ENCOUNTER — Telehealth (HOSPITAL_COMMUNITY): Payer: Self-pay | Admitting: *Deleted

## 2011-04-18 NOTE — Telephone Encounter (Signed)
Preadmission screen  

## 2011-04-19 ENCOUNTER — Encounter (HOSPITAL_COMMUNITY): Payer: Self-pay | Admitting: *Deleted

## 2011-04-19 ENCOUNTER — Other Ambulatory Visit (INDEPENDENT_AMBULATORY_CARE_PROVIDER_SITE_OTHER): Payer: Medicaid Other

## 2011-04-19 DIAGNOSIS — O169 Unspecified maternal hypertension, unspecified trimester: Secondary | ICD-10-CM

## 2011-04-21 ENCOUNTER — Inpatient Hospital Stay (HOSPITAL_COMMUNITY): Payer: Medicaid Other | Admitting: Anesthesiology

## 2011-04-21 ENCOUNTER — Encounter (HOSPITAL_COMMUNITY): Payer: Self-pay

## 2011-04-21 ENCOUNTER — Encounter (HOSPITAL_COMMUNITY): Payer: Self-pay | Admitting: Anesthesiology

## 2011-04-21 ENCOUNTER — Inpatient Hospital Stay (HOSPITAL_COMMUNITY)
Admission: AD | Admit: 2011-04-21 | Discharge: 2011-04-21 | Disposition: A | Discharge status: Home / Self Care | Payer: Medicaid Other | Source: Ambulatory Visit | Attending: Obstetrics & Gynecology | Admitting: Obstetrics & Gynecology

## 2011-04-21 ENCOUNTER — Inpatient Hospital Stay (HOSPITAL_COMMUNITY)
Admission: RE | Admit: 2011-04-21 | Discharge: 2011-04-23 | DRG: 767 | Disposition: A | Discharge status: Home / Self Care | Payer: Medicaid Other | Source: Ambulatory Visit | Attending: Obstetrics & Gynecology | Admitting: Obstetrics & Gynecology

## 2011-04-21 DIAGNOSIS — O139 Gestational [pregnancy-induced] hypertension without significant proteinuria, unspecified trimester: Principal | ICD-10-CM | POA: Diagnosis present

## 2011-04-21 DIAGNOSIS — O26899 Other specified pregnancy related conditions, unspecified trimester: Secondary | ICD-10-CM

## 2011-04-21 DIAGNOSIS — R519 Headache, unspecified: Secondary | ICD-10-CM

## 2011-04-21 DIAGNOSIS — Z302 Encounter for sterilization: Secondary | ICD-10-CM

## 2011-04-21 LAB — COMPREHENSIVE METABOLIC PANEL
ALT: 10 U/L (ref 0–35)
AST: 17 U/L (ref 0–37)
Albumin: 2.7 g/dL — ABNORMAL LOW (ref 3.5–5.2)
CO2: 24 mEq/L (ref 19–32)
Calcium: 9.6 mg/dL (ref 8.4–10.5)
Creatinine, Ser: 0.52 mg/dL (ref 0.50–1.10)
Sodium: 133 mEq/L — ABNORMAL LOW (ref 135–145)
Total Protein: 6 g/dL (ref 6.0–8.3)

## 2011-04-21 LAB — PROTEIN / CREATININE RATIO, URINE
Protein Creatinine Ratio: 0.19 — ABNORMAL HIGH (ref 0.00–0.15)
Total Protein, Urine: 6.3 mg/dL

## 2011-04-21 LAB — CBC
Hemoglobin: 12.1 g/dL (ref 12.0–15.0)
MCH: 28.7 pg (ref 26.0–34.0)
MCHC: 33.6 g/dL (ref 30.0–36.0)
Platelets: 289 10*3/uL (ref 150–400)
RDW: 14.5 % (ref 11.5–15.5)

## 2011-04-21 LAB — RPR: RPR Ser Ql: NONREACTIVE

## 2011-04-21 MED ORDER — SIMETHICONE 80 MG PO CHEW: 80.0000 mg | CHEWABLE_TABLET | ORAL | Status: DC | PRN

## 2011-04-21 MED ORDER — IBUPROFEN 600 MG PO TABS: 600.0000 mg | ORAL_TABLET | Freq: Four times a day (QID) | ORAL | Status: DC | PRN

## 2011-04-21 MED ORDER — OXYTOCIN 20 UNITS IN LACTATED RINGERS INFUSION - SIMPLE: 125.0000 mL/h | INTRAVENOUS | Status: AC

## 2011-04-21 MED ORDER — ONDANSETRON HCL 4 MG/2ML IJ SOLN: 4.0000 mg | INTRAMUSCULAR | Status: DC | PRN

## 2011-04-21 MED ORDER — PHENYLEPHRINE 40 MCG/ML (10ML) SYRINGE FOR IV PUSH (FOR BLOOD PRESSURE SUPPORT): 80.0000 ug | PREFILLED_SYRINGE | INTRAVENOUS | Status: DC | PRN

## 2011-04-21 MED ORDER — OXYCODONE-ACETAMINOPHEN 5-325 MG PO TABS: 1.0000 | ORAL_TABLET | ORAL | Status: DC | PRN

## 2011-04-21 MED ORDER — OXYTOCIN 20 UNITS IN LACTATED RINGERS INFUSION - SIMPLE
1.0000 m[IU]/min | INTRAVENOUS | Status: DC
  Administered 2011-04-21: 14 m[IU]/min via INTRAVENOUS
  Filled 2011-04-21: qty 1000

## 2011-04-21 MED ORDER — BENZOCAINE-MENTHOL 20-0.5 % EX AERO: 1.0000 "application " | INHALATION_SPRAY | CUTANEOUS | Status: DC | PRN

## 2011-04-21 MED ORDER — DIPHENHYDRAMINE HCL 25 MG PO CAPS: 25.0000 mg | ORAL_CAPSULE | Freq: Four times a day (QID) | ORAL | Status: DC | PRN

## 2011-04-21 MED ORDER — EPHEDRINE 5 MG/ML INJ: 10.0000 mg | INTRAVENOUS | Status: DC | PRN

## 2011-04-21 MED ORDER — OXYCODONE-ACETAMINOPHEN 5-325 MG PO TABS: 2.0000 | ORAL_TABLET | ORAL | Status: DC | PRN

## 2011-04-21 MED ORDER — ZOLPIDEM TARTRATE 10 MG PO TABS: 10.0000 mg | ORAL_TABLET | Freq: Every evening | ORAL | Status: DC | PRN

## 2011-04-21 MED ORDER — LACTATED RINGERS IV SOLN
500.0000 mL | Freq: Once | INTRAVENOUS | Status: AC
  Administered 2011-04-21: 1000 mL via INTRAVENOUS

## 2011-04-21 MED ORDER — EPHEDRINE 5 MG/ML INJ
INTRAVENOUS | Status: AC
  Filled 2011-04-21: qty 4

## 2011-04-21 MED ORDER — MISOPROSTOL 25 MCG QUARTER TABLET: 25.0000 ug | ORAL_TABLET | ORAL | Status: DC | PRN

## 2011-04-21 MED ORDER — DIPHENHYDRAMINE HCL 50 MG/ML IJ SOLN: 12.5000 mg | INTRAMUSCULAR | Status: DC | PRN

## 2011-04-21 MED ORDER — TERBUTALINE SULFATE 1 MG/ML IJ SOLN: 0.2500 mg | Freq: Once | INTRAMUSCULAR | Status: DC | PRN

## 2011-04-21 MED ORDER — LANOLIN HYDROUS EX OINT: TOPICAL_OINTMENT | CUTANEOUS | Status: DC | PRN

## 2011-04-21 MED ORDER — LIDOCAINE HCL 1.5 % IJ SOLN
INTRAMUSCULAR | Status: DC | PRN
  Administered 2011-04-21: 5 mL via EPIDURAL
  Administered 2011-04-21: 2 mL via EPIDURAL
  Administered 2011-04-21: 5 mL via EPIDURAL

## 2011-04-21 MED ORDER — IBUPROFEN 600 MG PO TABS
600.0000 mg | ORAL_TABLET | Freq: Four times a day (QID) | ORAL | Status: DC
  Administered 2011-04-21: 600 mg via ORAL
  Filled 2011-04-21: qty 1

## 2011-04-21 MED ORDER — FENTANYL 2.5 MCG/ML BUPIVACAINE 1/10 % EPIDURAL INFUSION (WH - ANES)
INTRAMUSCULAR | Status: AC
  Filled 2011-04-21: qty 60

## 2011-04-21 MED ORDER — TETANUS-DIPHTH-ACELL PERTUSSIS 5-2.5-18.5 LF-MCG/0.5 IM SUSP: 0.5000 mL | Freq: Once | INTRAMUSCULAR | Status: DC

## 2011-04-21 MED ORDER — LIDOCAINE HCL (PF) 1 % IJ SOLN: 30.0000 mL | INTRAMUSCULAR | Status: DC | PRN

## 2011-04-21 MED ORDER — DIBUCAINE 1 % RE OINT: 1.0000 "application " | TOPICAL_OINTMENT | RECTAL | Status: DC | PRN

## 2011-04-21 MED ORDER — ACETAMINOPHEN 325 MG PO TABS
650.0000 mg | ORAL_TABLET | ORAL | Status: DC | PRN
  Administered 2011-04-21: 650 mg via ORAL
  Filled 2011-04-21: qty 2

## 2011-04-21 MED ORDER — PHENYLEPHRINE 40 MCG/ML (10ML) SYRINGE FOR IV PUSH (FOR BLOOD PRESSURE SUPPORT)
PREFILLED_SYRINGE | INTRAVENOUS | Status: AC
  Filled 2011-04-21: qty 5

## 2011-04-21 MED ORDER — IBUPROFEN 600 MG PO TABS
600.0000 mg | ORAL_TABLET | Freq: Four times a day (QID) | ORAL | Status: DC
  Administered 2011-04-22 – 2011-04-23 (×6): 600 mg via ORAL
  Filled 2011-04-21 (×6): qty 1

## 2011-04-21 MED ORDER — PRENATAL PLUS 27-1 MG PO TABS
1.0000 | ORAL_TABLET | Freq: Every day | ORAL | Status: DC
  Administered 2011-04-23: 1 via ORAL
  Filled 2011-04-21: qty 1

## 2011-04-21 MED ORDER — OXYTOCIN BOLUS FROM INFUSION
500.0000 mL | Freq: Once | INTRAVENOUS | Status: DC
  Filled 2011-04-21: qty 500

## 2011-04-21 MED ORDER — ZOLPIDEM TARTRATE 5 MG PO TABS: 5.0000 mg | ORAL_TABLET | Freq: Every evening | ORAL | Status: DC | PRN

## 2011-04-21 MED ORDER — ONDANSETRON HCL 4 MG/2ML IJ SOLN
4.0000 mg | Freq: Four times a day (QID) | INTRAMUSCULAR | Status: DC | PRN
  Administered 2011-04-21: 4 mg via INTRAVENOUS
  Filled 2011-04-21: qty 2

## 2011-04-21 MED ORDER — EPHEDRINE 5 MG/ML INJ
10.0000 mg | INTRAVENOUS | Status: DC | PRN
  Administered 2011-04-21: 10 mg via INTRAVENOUS

## 2011-04-21 MED ORDER — FENTANYL 2.5 MCG/ML BUPIVACAINE 1/10 % EPIDURAL INFUSION (WH - ANES)
14.0000 mL/h | INTRAMUSCULAR | Status: DC
  Administered 2011-04-21: 14 mL/h via EPIDURAL

## 2011-04-21 MED ORDER — LACTATED RINGERS IV SOLN: 500.0000 mL | INTRAVENOUS | Status: DC | PRN

## 2011-04-21 MED ORDER — LACTATED RINGERS IV SOLN
INTRAVENOUS | Status: DC
  Administered 2011-04-21 (×2): via INTRAVENOUS

## 2011-04-21 MED ORDER — MEASLES, MUMPS & RUBELLA VAC ~~LOC~~ INJ
0.5000 mL | INJECTION | Freq: Once | SUBCUTANEOUS | Status: DC
  Filled 2011-04-21: qty 0.5

## 2011-04-21 MED ORDER — CITRIC ACID-SODIUM CITRATE 334-500 MG/5ML PO SOLN: 30.0000 mL | ORAL | Status: DC | PRN

## 2011-04-21 MED ORDER — SENNOSIDES-DOCUSATE SODIUM 8.6-50 MG PO TABS
2.0000 | ORAL_TABLET | Freq: Every day | ORAL | Status: DC
  Administered 2011-04-22: 2 via ORAL

## 2011-04-21 MED ORDER — WITCH HAZEL-GLYCERIN EX PADS: 1.0000 "application " | MEDICATED_PAD | CUTANEOUS | Status: DC | PRN

## 2011-04-21 MED ORDER — ONDANSETRON HCL 4 MG PO TABS: 4.0000 mg | ORAL_TABLET | ORAL | Status: DC | PRN

## 2011-04-21 NOTE — Anesthesia Preprocedure Evaluation (Signed)
Anesthesia Evaluation  Name, MR# and DOB Patient awake  General Assessment Comment  Reviewed: Allergy & Precautions, H&P , NPO status , Patient's Chart, lab work & pertinent test results, reviewed documented beta blocker date and time   History of Anesthesia Complications Negative for: history of anesthetic complications  Airway Mallampati: III      Dental  (+) Edentulous Upper and Edentulous Lower   Pulmonary  clear to auscultation  breath sounds clear to auscultation none    Cardiovascular hypertension (CHTN), regular Normal    Neuro/Psych   Headaches  (+) PSYCHIATRIC DISORDERS (anxiety),    GI/Hepatic/Renal negative GI ROS  negative Liver ROS  negative Renal ROS        Endo/Other  (+)   Morbid obesity  Abdominal   Musculoskeletal   Hematology negative hematology ROS (+)   Peds  Reproductive/Obstetrics (+) Pregnancy    Anesthesia Other Findings             Anesthesia Physical Anesthesia Plan  ASA: II  Anesthesia Plan: Epidural   Post-op Pain Management:    Induction:   Airway Management Planned:   Additional Equipment:   Intra-op Plan:   Post-operative Plan:   Informed Consent: I have reviewed the patients History and Physical, chart, labs and discussed the procedure including the risks, benefits and alternatives for the proposed anesthesia with the patient or authorized representative who has indicated his/her understanding and acceptance.     Plan Discussed with:   Anesthesia Plan Comments:         Anesthesia Quick Evaluation

## 2011-04-21 NOTE — Progress Notes (Signed)
MONICK RENA is a 30 y.o. [phone] at [redacted]w[redacted]d by ultrasound admitted for induction of labor due to Hypertension.  Subjective:   Objective: BP 146/78  Pulse 80  Temp(Src) 98.6 F (37 C) (Oral)  Resp 18  Ht 5' 0.5" (1.537 m)  Wt 105.688 kg (233 lb)  BMI 44.76 kg/m2  LMP 07/20/2010      FHT:  FHR: 140 bpm, variability: moderate,  accelerations:  Present,  decelerations:  Absent UC:   regular, every 2-3 minutes SVE:   Dilation: 4 Effacement (%): 80 Station: -1 Exam by:: Buddy Duty RN  Labs: Lab Results  Component Value Date   WBC 13.1* 04/21/2011   HGB 12.1 04/21/2011   HCT 36.0 04/21/2011   MCV 85.3 04/21/2011   PLT 289 04/21/2011    Assessment / Plan: Induction of labor due to gestational hypertension,  progressing well on pitocin  Labor: Progressing on Pitocin, will continue to increase then AROM Preeclampsia:  labs stable Fetal Wellbeing:  Category I Pain Control:  Labor support without medications I/D:   Anticipated MOD:  NSVD  CRESENZO-DISHMAN,Rathana Viveros 04/21/2011, 3:23 PM

## 2011-04-21 NOTE — H&P (Signed)
Desiree Jackson is a 30 y.o. female [phone] with IUP at [redacted]w[redacted]d presenting for induction of labor 2/2 GHTN, favorable cx. Pt states she has been having irregular, every 10 minutes, associated with none vaginal bleeding, intact, with active.  She desires to bilateral tubal ligation.  [facility] at [facility] since 8 wks  Prenatal History/Complications:  Past Medical History: Past Medical History  Diagnosis Date  . No pertinent past medical history   . Pregnancy induced hypertension     Past Surgical History: Past Surgical History  Procedure Date  . Mouth surgery   . Cholecystectomy 2001  . Knee arthroscopy w/ laser     L knee    Obstetrical History: OB History    Grav Para Term Preterm Abortions TAB SAB Ect Mult Living   5 1 1  3  3   1       Gynecological History: OB History    Grav Para Term Preterm Abortions TAB SAB Ect Mult Living   5 1 1  3  3   1       Social History: History   Social History  . Marital Status: Married    Spouse Name: N/A    Number of Children: N/A  . Years of Education: N/A   Social History Main Topics  . Smoking status: Never Smoker   . Smokeless tobacco: Not on file  . Alcohol Use: No  . Drug Use: No  . Sexually Active:    Other Topics Concern  . Not on file   Social History Narrative  . No narrative on file    Family History: Family History  Problem Relation Age of Onset  . Heart disease Father   . Hypertension Father   . Diabetes Father   . Cancer Father   . Heart disease Brother   . Diabetes Brother   . Birth defects Brother     Heart deffect at birth  . Cancer Maternal Grandmother   . Heart disease Paternal Grandfather     Allergies: Allergies  Allergen Reactions  . Penicillins     REACTION: anaphalaxis    Prescriptions prior to admission  Medication Sig Dispense Refill  . acetaminophen (TYLENOL) 500 MG tablet Take 1,000 mg by mouth every 6 (six) hours as needed. Patient used medication for pain.       .  cetirizine (ZYRTEC) 10 MG tablet Take 10 mg by mouth daily.        Marland Kitchen esomeprazole (NEXIUM) 40 MG capsule Take 40 mg by mouth daily before breakfast.          Review of Systems - Negative except mild headache   Blood pressure 136/81, pulse 78, temperature 98.8 F (37.1 C), temperature source Oral, resp. rate 20, height 5' 0.5" (1.537 m), weight 105.688 kg (233 lb), last menstrual period 07/20/2010. General appearance: alert, cooperative and no distress Eyes: No c/o vision changes Lungs: clear to auscultation bilaterally Heart: regular rate and rhythm Abdomen: soft, non-tender; bowel sounds normal; no masses,  no organomegaly Extremities: edema 2 + Neurologic: Reflexes: 2+ and symmetric cephalic Baseline: 140 bpm, Variability: Good {> 6 bpm), Accelerations: Reactive and Decelerations: Absent COntractions:  Irregular and mild Dilation: 3.5 Effacement (%): 50 Station: -2 Exam by:: Dr. Sherin Quarry   Prenatal labs: ABO, Rh: A/Positive/-- (02/14 0000) Antibody: Negative (02/14 0000) Rubella:   RPR: Nonreactive (02/14 0000)  HBsAg: Negative (02/14 0000)  HIV: Non-reactive (02/14 0000)  GBS: NEGATIVE (08/06 1330)  1 hr Glucola Genetic screening  Anatomy  US   Assessment: Desiree Jackson is a 30 y.o. [phone] with an IUP at [redacted]w[redacted]d presenting for IOL 2/2 GHTN  Plan: Pitocin, Check preeclampsia labs   CRESENZO-DISHMAN,Narely Nobles 04/21/2011, 10:54 AM

## 2011-04-21 NOTE — Progress Notes (Signed)
BLUMA BURESH is a 30 y.o. [phone] at [redacted]w[redacted]d by LMP admitted for induction of labor due to Hypertension.  Subjective:   Objective: BP 133/73  Pulse 80  Temp(Src) 98.6 F (37 C) (Oral)  Resp 18  Ht 5' 0.5" (1.537 m)  Wt 105.688 kg (233 lb)  BMI 44.76 kg/m2  LMP 07/20/2010      FHT:  FHR: 130 bpm, variability: moderate,  accelerations:  Present,  decelerations:  Absent UC:   irregular, every 2-5 minutes SVE:   Dilation:  (No change) Effacement (%): 50 Station: -2 Exam by:: Dr. Sherin Quarry  Labs: Lab Results  Component Value Date   WBC 13.1* 04/21/2011   HGB 12.1 04/21/2011   HCT 36.0 04/21/2011   MCV 85.3 04/21/2011   PLT 289 04/21/2011    Assessment / Plan: Induction of labor due to gestational hypertension,  progressing well on pitocin  Labor: not yet Preeclampsia:  labs stable Fetal Wellbeing:  Category I Pain Control:  Labor support without medications I/D:   Anticipated MOD:  NSVD  CRESENZO-DISHMAN,Lloyd Ayo 04/21/2011, 1:45 PM

## 2011-04-21 NOTE — Progress Notes (Signed)
Desiree Jackson is a 30 y.o. [phone] at [redacted]w[redacted]d by LMP admitted for induction of labor due to Hypertension.  Subjective:   Objective: BP 111/63  Pulse 68  Temp(Src) 97.9 F (36.6 C) (Axillary)  Resp 20  Ht 5' 0.5" (1.537 m)  Wt 105.688 kg (233 lb)  BMI 44.76 kg/m2  SpO2 100%  LMP 07/20/2010      FHT:  FHR: 130 bpm, variability: moderate,  accelerations:  Present,  decelerations:  Absent UC:   regular, every 1-2 minutes SVE:   Dilation: 4 Effacement (%): 80 Station: -1 Exam by:: Dr. Ladoris Gene AROM with clear fluid.  IUPC placed, but unable to thread past 30 cms.  Pt immediately started having painful contractions  Labs: Lab Results  Component Value Date   WBC 13.1* 04/21/2011   HGB 12.1 04/21/2011   HCT 36.0 04/21/2011   MCV 85.3 04/21/2011   PLT 289 04/21/2011    Assessment / Plan: IOL, not responding to pitocin.  Labor:  Preeclampsia:  labs stable Fetal Wellbeing:  Category I Pain Control:  Labor support without medications I/D:   Anticipated MOD:  NSVD  CRESENZO-DISHMAN,Desiree Jackson 04/21/2011, 6:57 PM

## 2011-04-21 NOTE — Anesthesia Procedure Notes (Addendum)
Epidural Patient location during procedure: OB Start time: 04/21/2011 5:56 PM Reason for block: procedure for pain  Staffing Performed by: anesthesiologist   Preanesthetic Checklist Completed: patient identified, site marked, surgical consent, pre-op evaluation, timeout performed, IV checked, risks and benefits discussed and monitors and equipment checked  Epidural Patient position: sitting Prep: site prepped and draped and DuraPrep Patient monitoring: continuous pulse ox and blood pressure Approach: midline Injection technique: LOR air  Needle:  Needle type: Tuohy  Needle gauge: 17 G Needle length: 9 cm Catheter type: closed end flexible Catheter size: 19 Gauge Test dose: negative  Assessment Events: blood not aspirated, injection not painful, no injection resistance, negative IV test and no paresthesia

## 2011-04-22 ENCOUNTER — Encounter (HOSPITAL_COMMUNITY): Payer: Self-pay | Admitting: Anesthesiology

## 2011-04-22 ENCOUNTER — Encounter (HOSPITAL_COMMUNITY)
Admission: RE | Disposition: A | Discharge status: Home / Self Care | Payer: Self-pay | Source: Ambulatory Visit | Attending: Obstetrics & Gynecology

## 2011-04-22 ENCOUNTER — Inpatient Hospital Stay (HOSPITAL_COMMUNITY): Payer: Medicaid Other | Admitting: Anesthesiology

## 2011-04-22 DIAGNOSIS — Z302 Encounter for sterilization: Secondary | ICD-10-CM

## 2011-04-22 HISTORY — PX: TUBAL LIGATION: SHX77

## 2011-04-22 SURGERY — LIGATION, FALLOPIAN TUBE, POSTPARTUM
Anesthesia: Epidural | Site: Abdomen | Laterality: Bilateral | Wound class: Clean Contaminated

## 2011-04-22 MED ORDER — ONDANSETRON HCL 4 MG/2ML IJ SOLN
INTRAMUSCULAR | Status: AC
  Filled 2011-04-22: qty 2

## 2011-04-22 MED ORDER — FENTANYL CITRATE 0.05 MG/ML IJ SOLN
INTRAMUSCULAR | Status: AC
  Filled 2011-04-22: qty 2

## 2011-04-22 MED ORDER — PROPOFOL 10 MG/ML IV EMUL
INTRAVENOUS | Status: AC
  Filled 2011-04-22: qty 50

## 2011-04-22 MED ORDER — LIDOCAINE HCL (CARDIAC) 20 MG/ML IV SOLN
INTRAVENOUS | Status: AC
  Filled 2011-04-22: qty 5

## 2011-04-22 MED ORDER — BENZOCAINE-MENTHOL 20-0.5 % EX AERO
INHALATION_SPRAY | CUTANEOUS | Status: AC
  Filled 2011-04-22: qty 56

## 2011-04-22 MED ORDER — DEXAMETHASONE SODIUM PHOSPHATE 10 MG/ML IJ SOLN
INTRAMUSCULAR | Status: AC
  Filled 2011-04-22: qty 1

## 2011-04-22 MED ORDER — FENTANYL CITRATE 0.05 MG/ML IJ SOLN
INTRAMUSCULAR | Status: DC | PRN
  Administered 2011-04-22: 25 ug via INTRAVENOUS
  Administered 2011-04-22 (×2): 50 ug via INTRAVENOUS
  Administered 2011-04-22: 25 ug via INTRAVENOUS
  Administered 2011-04-22: 50 ug via INTRAVENOUS

## 2011-04-22 MED ORDER — MIDAZOLAM HCL 2 MG/2ML IJ SOLN
INTRAMUSCULAR | Status: AC
  Filled 2011-04-22: qty 2

## 2011-04-22 MED ORDER — SODIUM BICARBONATE 8.4 % IV SOLN
INTRAVENOUS | Status: DC | PRN
  Administered 2011-04-22: 3 mL via EPIDURAL

## 2011-04-22 MED ORDER — DEXAMETHASONE SODIUM PHOSPHATE 10 MG/ML IJ SOLN
INTRAMUSCULAR | Status: DC | PRN
  Administered 2011-04-22: 10 mg via INTRAVENOUS

## 2011-04-22 MED ORDER — MIDAZOLAM HCL 5 MG/5ML IJ SOLN
INTRAMUSCULAR | Status: DC | PRN
  Administered 2011-04-22: 1 mg via INTRAVENOUS
  Administered 2011-04-22: 2 mg via INTRAVENOUS

## 2011-04-22 MED ORDER — LIDOCAINE-EPINEPHRINE (PF) 2 %-1:200000 IJ SOLN
INTRAMUSCULAR | Status: AC
  Filled 2011-04-22: qty 20

## 2011-04-22 MED ORDER — FAMOTIDINE 20 MG PO TABS
40.0000 mg | ORAL_TABLET | Freq: Once | ORAL | Status: AC
  Administered 2011-04-22: 40 mg via ORAL
  Filled 2011-04-22 (×2): qty 1

## 2011-04-22 MED ORDER — LACTATED RINGERS IV SOLN
INTRAVENOUS | Status: DC
  Administered 2011-04-22 (×2): via INTRAVENOUS

## 2011-04-22 MED ORDER — SODIUM CHLORIDE 0.9 % IR SOLN
Status: DC | PRN
  Administered 2011-04-22: 1000 mL

## 2011-04-22 MED ORDER — ONDANSETRON HCL 4 MG/2ML IJ SOLN
INTRAMUSCULAR | Status: DC | PRN
  Administered 2011-04-22: 4 mg via INTRAVENOUS

## 2011-04-22 MED ORDER — METOCLOPRAMIDE HCL 10 MG PO TABS
10.0000 mg | ORAL_TABLET | Freq: Once | ORAL | Status: AC
  Administered 2011-04-22: 10 mg via ORAL
  Filled 2011-04-22: qty 1

## 2011-04-22 SURGICAL SUPPLY — 23 items
CLIP FILSHIE TUBAL LIGA STRL (Clip) ×1 IMPLANT
CLOTH BEACON ORANGE TIMEOUT ST (SAFETY) ×2 IMPLANT
CONTAINER PREFILL 10% NBF 15ML (MISCELLANEOUS) ×4 IMPLANT
DRAPE UTILITY XL STRL (DRAPES) ×1 IMPLANT
DRSG COVADERM PLUS 2X2 (GAUZE/BANDAGES/DRESSINGS) ×1 IMPLANT
GLOVE BIOGEL PI IND STRL 9 (GLOVE) ×2 IMPLANT
GLOVE BIOGEL PI INDICATOR 9 (GLOVE) ×2
GLOVE ECLIPSE 9.0 STRL (GLOVE) ×2 IMPLANT
GLOVE SKINSENSE NS SZ6.5 (GLOVE) ×2
GLOVE SKINSENSE STRL SZ6.5 (GLOVE) IMPLANT
GOWN PREVENTION PLUS LG XLONG (DISPOSABLE) ×1 IMPLANT
GOWN PREVENTION PLUS XLARGE (GOWN DISPOSABLE) ×2 IMPLANT
GOWN STRL REIN 3XL LVL4 (GOWN DISPOSABLE) ×2 IMPLANT
NS IRRIG 1000ML POUR BTL (IV SOLUTION) ×2 IMPLANT
PACK ABDOMINAL MINOR (CUSTOM PROCEDURE TRAY) ×2 IMPLANT
SPONGE LAP 4X18 X RAY DECT (DISPOSABLE) ×1 IMPLANT
SUT CHROMIC 2 0 CT 1 (SUTURE) ×2 IMPLANT
SUT VIC AB 0 CT1 27 (SUTURE) ×2
SUT VIC AB 0 CT1 27XBRD ANBCTR (SUTURE) IMPLANT
SUT VIC AB 4-0 PS2 27 (SUTURE) ×2 IMPLANT
TOWEL OR 17X24 6PK STRL BLUE (TOWEL DISPOSABLE) ×3 IMPLANT
TRAY FOLEY CATH 14FR (SET/KITS/TRAYS/PACK) ×2 IMPLANT
WATER STERILE IRR 1000ML POUR (IV SOLUTION) ×1 IMPLANT

## 2011-04-22 NOTE — Anesthesia Postprocedure Evaluation (Signed)
Anesthesia Post Note  Patient: Desiree Jackson  Procedure(s) Performed:  POST PARTUM TUBAL LIGATION - Bilateral post partum tubal ligation with filshie clips.   Patient is awake, responsive, moving her legs, and has signs of resolution of her numbness. Pain and nausea are reasonably well controlled. Vital signs are stable and clinically acceptable. Oxygen saturation is clinically acceptable. There are no apparent anesthetic complications at this time. Patient is ready for discharge.

## 2011-04-22 NOTE — H&P (Signed)
Agree with above note.  Desiree Jackson 04/22/2011 6:05 AM

## 2011-04-22 NOTE — Pre-Procedure Instructions (Signed)
I have evaluated Desiree Jackson preoperatively, and have identified no interval change in the medical condition or plan of care since the history and physical of record.   Pre Op counsel regarding tubal ligation performed, and patient confirms desired permanent sterilization, with risks and potential complications including but not limited to bleeding , injury to other organs, and specific discussion of slight risk of Tubal sterilization failure reviewed with patient.  Filshie clip reviewed with patient.

## 2011-04-22 NOTE — Progress Notes (Signed)
Referral received from CN for LCSW consult.  Referral screened out due to no concerns noted by care providers and no noted history of behavioral health diagnoses.  Care team informed can certainly contact LCSW for support if indicated. Bedside RN expressed understanding.

## 2011-04-22 NOTE — Progress Notes (Signed)
Post Partum Day 1 Subjective: Patient doing well without any complaints. +OOB, +void. Denies CP, SOB, lightheadedness or dizziness. Patient desires BTL  Objective: Blood pressure 112/73, pulse 88, temperature 97.8 F (36.6 C), temperature source Oral, resp. rate 18, height 5' 0.5" (1.537 m), weight 105.688 kg (233 lb), last menstrual period 07/20/2010, SpO2 96.00%, unknown if currently breastfeeding.  Physical Exam:  General: alert, cooperative and no distress Lochia: appropriate Uterine Fundus: firm Incision: n/a DVT Evaluation: No evidence of DVT seen on physical exam. No cords or calf tenderness. No significant calf/ankle edema.   Basename 04/21/11 0955  HGB 12.1  HCT 36.0    Assessment/Plan: Contraception BTL schedule at 9am today - breastfeeding well - patient planning outpatient circumcision -Patient has been NPO since 12 am -Risks and benefits of procedure discussed with patient including permanence of method, bleeding, infection, injury to surrounding organs and need for additional procedures. Risk failure of 0.5-1% with increased risk of ectopic gestation if pregnancy occurs was also discussed with patient.     LOS: 1 day   Keean Wilmeth 04/22/2011, 6:56 AM

## 2011-04-22 NOTE — Anesthesia Procedure Notes (Addendum)
Epidural

## 2011-04-22 NOTE — Transfer of Care (Signed)
  Anesthesia Post-op Note  Patient: Desiree Jackson  Procedure(s) Performed:  POST PARTUM TUBAL LIGATION - Bilateral post partum tubal ligation with filshie clips.  Patient Location: PACU  Anesthesia Type: MAC and Epidural  Level of Consciousness: awake, alert  and oriented  Airway and Oxygen Therapy: Patient Spontanous Breathing  Post-op Pain: none  Post-op Assessment: Post-op Vital signs reviewed, Patient's Cardiovascular Status Stable, Respiratory Function Stable, Patent Airway, No signs of Nausea or vomiting and Pain level controlled  Post-op Vital Signs: stable  Complications: No apparent anesthesia complications

## 2011-04-22 NOTE — Op Note (Signed)
Operative note  Preop diagnosis: Elective sterilization postpartum  Postop diagnosis: Elective sterilization postpartum by Filshie clip  Procedure: Postpartum tubal sterilization Filshie clip placement  surgeon Lanyah Spengler  Asst. none  Anesthesia epidural  Complications none  Estimated blood loss: Minimal  Details of procedure: The patient was taken to the operating room, prepped and draped for lower abdominal surgery. Foley catheter was in place. Timeout was conducted and procedure confirmed by all parties involved. An infraumbilical 2 cm transverse incision was performed through skin and subcutaneous fatty tissues. Fascia was identified, grasped with Coker clamps, and opened transversely. The peritoneum was opened bluntly, and fallopian tubes identified with the assistance of retractors and moistened laparotomy tape. The right fallopian tube was identified to its fimbriated end, and a Filshie clip applied to the midportion of the tube. The left fallopian tube was identified as well and a Filshie clip applied to the midportion of the tube. Sponge and needle counts were correct. Fascia was closed using continuous running 0 Vicryl suture. Subcutaneous tissues were irrigated, and closed with 4-0 Vicryl suture. Skin was closed with subcuticular 4-0 Vicryl continuous running suture. Sponge and needle counts were correct with the patient going to recovery room in good condition to resume routine postpartum care.

## 2011-04-22 NOTE — Anesthesia Preprocedure Evaluation (Addendum)
Anesthesia Evaluation  Name, MR# and DOB Patient awake  General Assessment Comment  Reviewed: Allergy & Precautions, H&P , NPO status , Patient's Chart, lab work & pertinent test results, reviewed documented beta blocker date and time   History of Anesthesia Complications Negative for: history of anesthetic complications  Airway Mallampati: III      Dental  (+) Edentulous Upper and Edentulous Lower   Pulmonary  clear to auscultation  breath sounds clear to auscultation none    Cardiovascular hypertension (CHTN), regular Normal    Neuro/Psych   Headaches  (+) PSYCHIATRIC DISORDERS (anxiety),    GI/Hepatic/Renal negative GI ROS  negative Liver ROS  negative Renal ROS        Endo/Other  (+)   Morbid obesity  Abdominal   Musculoskeletal   Hematology negative hematology ROS (+)   Peds  Reproductive/Obstetrics (+) Pregnancy    Anesthesia Other Findings             Anesthesia Physical Anesthesia Plan  ASA: III  Anesthesia Plan: Epidural   Post-op Pain Management:    Induction:   Airway Management Planned:   Additional Equipment:   Intra-op Plan:   Post-operative Plan:   Informed Consent: I have reviewed the patients History and Physical, chart, labs and discussed the procedure including the risks, benefits and alternatives for the proposed anesthesia with the patient or authorized representative who has indicated his/her understanding and acceptance.   Dental Advisory Given  Plan Discussed with: CRNA and Surgeon  Anesthesia Plan Comments: (Labs checked- platelets confirmed Discussed epidural, and patient consents to the procedure:  included risk of possible headache,backache, failed block, allergic reaction, and nerve injury. This patient was asked if she had any questions or concerns before the procedure started. )        Anesthesia Quick Evaluation

## 2011-04-22 NOTE — Brief Op Note (Signed)
04/22/2011  10:23 AM  PATIENT:  Desiree Jackson  30 y.o. female  PRE-OPERATIVE DIAGNOSIS:  Desires Sterilization  POST-OPERATIVE DIAGNOSIS:  Desires sterilization  PROCEDURE:  Procedure(s): POST PARTUM TUBAL LIGATION  With Filshie clip placement.  SURGEON:  Surgeon(s): Tilda Burrow, MD  PHYSICIAN ASSISTANT:   ASSISTANTS: none   ANESTHESIA:   epidural  ESTIMATED BLOOD LOSS: * No blood loss amount entered *   BLOOD ADMINISTERED:none  DRAINS: Urinary Catheter (Foley)   LOCAL MEDICATIONS USED:  NONE  SPECIMEN:  No Specimen  DISPOSITION OF SPECIMEN:  N/A  COUNTS:  YES  TOURNIQUET:  * No tourniquets in log *  DICTATION #: [id number]  PLAN OF CARE: continue inpatient status x 24 hrs  PATIENT DISPOSITION:  PACU - hemodynamically stable.   Delay start of Pharmacological VTE agent (>24hrs) due to surgical blood loss or risk of bleeding:  not applicable

## 2011-04-23 MED ORDER — LORATADINE 10 MG PO TABS
10.0000 mg | ORAL_TABLET | Freq: Every day | ORAL | Status: DC
  Administered 2011-04-23: 10 mg via ORAL
  Filled 2011-04-23 (×2): qty 1

## 2011-04-23 MED ORDER — OXYCODONE-ACETAMINOPHEN 5-325 MG PO TABS: 1.0000 | ORAL_TABLET | ORAL | Status: AC | PRN

## 2011-04-23 MED ORDER — PRENATAL PLUS 27-1 MG PO TABS: 1.0000 | ORAL_TABLET | Freq: Every day | ORAL | Status: DC

## 2011-04-23 MED ORDER — IBUPROFEN 600 MG PO TABS: 600.0000 mg | ORAL_TABLET | Freq: Four times a day (QID) | ORAL | Status: AC

## 2011-04-23 NOTE — Progress Notes (Signed)
Post Partum Day 2 Subjective: no complaints, up ad lib, voiding, tolerating PO and + flatus  Objective: Blood pressure 111/67, pulse 89, temperature 98.4 F (36.9 C), temperature source Oral, resp. rate 18, height 5' 0.5" (1.537 m), weight 105.688 kg (233 lb), last menstrual period 07/20/2010, SpO2 97.00%, unknown if currently breastfeeding.  Physical Exam:  General: alert, cooperative, appears stated age and no distress Lochia: appropriate Uterine Fundus: firm Incision: abd band aid dry DVT Evaluation: No evidence of DVT seen on physical exam. Negative Homan's sign. No cords or calf tenderness. No significant calf/ankle edema.   Basename 04/21/11 0955  HGB 12.1  HCT 36.0    Assessment/Plan: Discharge home and Breastfeeding   LOS: 2 days   Zerita Boers 04/23/2011, 6:43 AM

## 2011-04-23 NOTE — Op Note (Signed)
NAME:  Desiree Jackson, Desiree Jackson                ACCOUNT NO.:  MEDICAL RECORD NO.:  [id number]  LOCATION:                                 FACILITY:  PHYSICIAN:  Tilda Burrow, M.D. DATE OF BIRTH:  [birth year]  DATE OF PROCEDURE:  04/22/2011 DATE OF DISCHARGE:                              OPERATIVE REPORT   PREOPERATIVE DIAGNOSIS:  Elective permanent sterilization postpartum.  POSTOPERATIVE DIAGNOSIS:  Elective permanent sterilization postpartum.  PROCEDURE:  Postpartum tubal ligation with Filshie clip placement.  SURGEON:  Tilda Burrow, MD  ASSISTANT:  None.  ANESTHESIA:  Epidural.  COMPLICATIONS:  None.  FINDINGS:  Extensive abdominal and pre peritoneal fat.  Normal tubes bilaterally.  COMPLICATIONS:  None.  SPECIMEN:  None.  COUNTS:  Correct.  INDICATIONS:  A 30 year old female desiring permanent postpartum sterilization with previous tubal sterilization form signed well in advance per Medicaid rolls, reviewed preprocedure.  DETAILS OF PROCEDURE:  The patient was taken to the operating room, prepped and draped for lower abdominal surgery with Foley catheter in place.  Time-out was conducted and confirmed by involved parties.  An infraumbilical transverse 2-cm skin incision was performed along the lines of her prior laparoscopic procedure.  The preperitoneal and extra fascial fat was extensive but with care we were able to use Kocher clamps, grasped the fascial edges, opened it, bluntly entered the peritoneal cavity using fingertip, penetration of the peritoneum and then identified.  The internal organs, there was no suspicion of bleeding or injury to organs associated with entry.  There was a Foley catheter in place that had been placed at the onset of the procedure. The fallopian tubes were able to be identified sequentially, with the right fallopian tube identified to its fimbriated end with a Filshie clip attached to its midportion with confirmation of proper  positioning. Opposite tube was treated in similar fashion.  Procedure was completed by closure of the fascia with running 0 Vicryl followed by subcuticular 4-0 Vicryl closure of the fatty tissues after irrigation of the incision and subcuticular 4-0 Vicryl closure of skin.  Sponge and needle counts correct.  Estimated blood loss minimal.  Condition to recovery room was excellent.     Tilda Burrow, M.D.     JVF/MEDQ  D:  04/22/2011  T:  04/22/2011  Job:  [id number]

## 2011-04-23 NOTE — Anesthesia Postprocedure Evaluation (Signed)
  Anesthesia Post-op Note  Patient: Desiree Jackson  Procedure(s) Performed: * No procedures listed *  Patient Location: PACU and Mother/Baby  Anesthesia Type: Epidural  Level of Consciousness: awake, alert  and oriented  Airway and Oxygen Therapy: Patient Spontanous Breathing  Post-op Pain: none  Post-op Assessment: Post-op Vital signs reviewed and Patient's Cardiovascular Status Stable  Post-op Vital Signs: Reviewed and stable  Complications: No apparent anesthesia complications

## 2011-04-23 NOTE — Discharge Summary (Signed)
Obstetric Discharge Summary Reason for Admission: onset of labor Prenatal Procedures: ultrasound Intrapartum Procedures: spontaneous vaginal delivery and tubal ligation Postpartum Procedures: P.P. tubal ligation Complications-Operative and Postpartum: none Hemoglobin  Date Value Range Status  04/21/2011 12.1  12.0-15.0 (g/dL) Final     HCT  Date Value Range Status  04/21/2011 36.0  36.0-46.0 (%) Final    Discharge Diagnoses: Term Pregnancy-delivered  Discharge Information: Date: 04/23/2011 Activity: pelvic rest Diet: routine Medications: PNV, Ibuprophen and Percocet Condition: stable and improved Instructions: refer to practice specific booklet Discharge to: home   Newborn Data: Live born female  Birth Weight: 6 lb 15.1 oz (3150 g) APGAR: 8, 9  Home with mother.  Zerita Boers 04/23/2011, 6:50 AM

## 2011-04-23 NOTE — Progress Notes (Signed)
UR chart review completed.  

## 2011-04-26 ENCOUNTER — Encounter: Payer: Self-pay | Admitting: *Deleted

## 2011-04-27 ENCOUNTER — Ambulatory Visit (INDEPENDENT_AMBULATORY_CARE_PROVIDER_SITE_OTHER): Payer: Medicaid Other | Admitting: Family

## 2011-04-27 VITALS — BP 128/90 | HR 78 | Resp 17 | Ht 63.0 in | Wt 224.0 lb

## 2011-04-27 DIAGNOSIS — Z136 Encounter for screening for cardiovascular disorders: Secondary | ICD-10-CM

## 2011-04-27 DIAGNOSIS — Z0130 Encounter for examination of blood pressure without abnormal findings: Secondary | ICD-10-CM

## 2011-04-27 MED ORDER — ESOMEPRAZOLE MAGNESIUM 20 MG PO CPDR: 40.0000 mg | DELAYED_RELEASE_CAPSULE | Freq: Every day | ORAL | Status: DC

## 2011-04-27 NOTE — Progress Notes (Signed)
  Subjective:    Patient ID: Desiree Jackson, female    DOB: 10/05/1980, 30 y.o.   MRN: [id number]  HPI Pt here for blood pressure check.  Pt had hypotension upon release from hospital following delivery.  Denies any problems today.  "Everything is great".   Review of Systems  Constitutional: Negative.   Respiratory: Negative.   Cardiovascular: Negative.   Neurological: Negative for dizziness.       Objective:   Physical Exam  Constitutional: She is oriented to person, place, and time. She appears well-developed and well-nourished. No distress.  HENT:  Head: Normocephalic and atraumatic.  Neck: Normal range of motion. Neck supple.  Cardiovascular: Normal rate and regular rhythm.   Pulmonary/Chest: Effort normal.  Lymphadenopathy:       Trace pedal swelling  Neurological: She is alert and oriented to person, place, and time.  Skin: Skin is dry.          Assessment & Plan:  A:  BP check Plan: DC to home Follow-up postpartum as instructed. [facility]

## 2011-05-08 ENCOUNTER — Encounter (HOSPITAL_COMMUNITY): Payer: Self-pay | Admitting: Obstetrics and Gynecology

## 2011-05-29 ENCOUNTER — Ambulatory Visit (INDEPENDENT_AMBULATORY_CARE_PROVIDER_SITE_OTHER): Payer: Medicaid Other | Admitting: Obstetrics & Gynecology

## 2011-05-29 ENCOUNTER — Encounter: Payer: Self-pay | Admitting: Obstetrics & Gynecology

## 2011-05-29 VITALS — BP 110/77 | HR 84 | Temp 98.4°F | Resp 16 | Ht 62.0 in | Wt 214.0 lb

## 2011-05-29 DIAGNOSIS — K2960 Other gastritis without bleeding: Secondary | ICD-10-CM

## 2011-05-29 LAB — COMPREHENSIVE METABOLIC PANEL
ALT: 23
Albumin: 4.6
Alkaline Phosphatase: 56
BUN: 10
Chloride: 104
Glucose, Bld: 99
Potassium: 4
Sodium: 141
Total Bilirubin: 0.7
Total Protein: 7.8

## 2011-05-29 LAB — URINALYSIS, ROUTINE W REFLEX MICROSCOPIC
Bilirubin Urine: NEGATIVE
Glucose, UA: NEGATIVE
Ketones, ur: NEGATIVE
Protein, ur: NEGATIVE
pH: 6.5

## 2011-05-29 LAB — WET PREP, GENITAL: Clue Cells Wet Prep HPF POC: NONE SEEN

## 2011-05-29 LAB — DIFFERENTIAL
Basophils Absolute: 0.1
Basophils Relative: 0
Eosinophils Absolute: 0.1
Monocytes Absolute: 0.7
Monocytes Relative: 5
Neutro Abs: 9.5 — ABNORMAL HIGH
Neutrophils Relative %: 75

## 2011-05-29 LAB — CBC
HCT: 43.5
Hemoglobin: 15.1 — ABNORMAL HIGH
RDW: 12.4
WBC: 12.8 — ABNORMAL HIGH

## 2011-05-29 LAB — HCG, QUANTITATIVE, PREGNANCY: hCG, Beta Chain, Quant, S: <2

## 2011-05-29 LAB — RPR: RPR Ser Ql: NONREACTIVE

## 2011-05-29 LAB — GC/CHLAMYDIA PROBE AMP, GENITAL
Chlamydia, DNA Probe: NEGATIVE
GC Probe Amp, Genital: NEGATIVE

## 2011-05-29 MED ORDER — ESOMEPRAZOLE MAGNESIUM 20 MG PO PACK
20.0000 mg | PACK | Freq: Every day | ORAL | Status: DC
Start: 2011-05-29 — End: 2011-12-10

## 2011-05-29 NOTE — Progress Notes (Signed)
  Subjective:    Patient ID: Desiree Jackson, female    DOB: 1981/05/26, 30 y.o.   MRN: [id number]  HPI  Ms. Panebianco had a NSVD and a PPS almost 6 weeks ago.  She denies any bowel or bladder issues.  Her baby is having lots of reflux and is not sleeping which is why she believes she scored a "10" on the depression scale.  She denies feeling depressed, just frustrated at the thought of changing to bottlefeeding as the pediatrician has suggested.  She denies HI or SI.  She declines meds or referral.  She has not yet had sex.  She is planning to schedule her annual exam in the next few weeks.  Review of Systems     Objective:   Physical Exam  Perineum well healed. Uterus involuted on bimanual exam.  No adnexal masses.     Assessment & Plan:  ]post partum- stable Annual to be scheduled

## 2011-05-29 NOTE — Progress Notes (Signed)
Addended by: Allie Bossier on: 05/29/2011 01:37 PM   Modules accepted: Orders

## 2011-05-31 ENCOUNTER — Telehealth: Payer: Self-pay | Admitting: *Deleted

## 2011-05-31 DIAGNOSIS — F53 Postpartum depression: Secondary | ICD-10-CM

## 2011-05-31 DIAGNOSIS — O99345 Other mental disorders complicating the puerperium: Secondary | ICD-10-CM

## 2011-05-31 MED ORDER — CITALOPRAM HYDROBROMIDE 10 MG PO TABS: 10.0000 mg | ORAL_TABLET | Freq: Every day | ORAL | Status: DC

## 2011-05-31 NOTE — Telephone Encounter (Signed)
Pt called requesting something for depression.  She was here on 05/29/11 for her Post partum visit and scored a 10 on her depression scale.  She declined any mediciation at that time but called today and requested something to be called in to [facility] in [place].  New born is being hospitalized today for testing of reflux.  Spoke with Dr Marice Potter via phone and Celexa 10mg  #30 one every HS ordered.  Pt to followup in 1 month or sooner if needed.

## 2011-06-01 ENCOUNTER — Ambulatory Visit: Payer: Medicaid Other

## 2011-06-26 ENCOUNTER — Encounter: Payer: Self-pay | Admitting: Obstetrics & Gynecology

## 2011-06-26 ENCOUNTER — Ambulatory Visit (INDEPENDENT_AMBULATORY_CARE_PROVIDER_SITE_OTHER): Payer: Medicaid Other | Admitting: Obstetrics & Gynecology

## 2011-06-26 ENCOUNTER — Other Ambulatory Visit (HOSPITAL_COMMUNITY)
Admission: RE | Admit: 2011-06-26 | Discharge: 2011-06-26 | Disposition: A | Discharge status: Home / Self Care | Payer: Medicaid Other | Source: Ambulatory Visit | Attending: Obstetrics & Gynecology | Admitting: Obstetrics & Gynecology

## 2011-06-26 VITALS — BP 125/74 | HR 73 | Temp 98.6°F | Resp 16 | Ht 62.0 in | Wt 219.0 lb

## 2011-06-26 DIAGNOSIS — Z01419 Encounter for gynecological examination (general) (routine) without abnormal findings: Secondary | ICD-10-CM | POA: Insufficient documentation

## 2011-06-26 DIAGNOSIS — Z23 Encounter for immunization: Secondary | ICD-10-CM

## 2011-06-26 DIAGNOSIS — Z113 Encounter for screening for infections with a predominantly sexual mode of transmission: Secondary | ICD-10-CM | POA: Insufficient documentation

## 2011-06-26 DIAGNOSIS — Z0000 Encounter for general adult medical examination without abnormal findings: Secondary | ICD-10-CM

## 2011-06-26 NOTE — Progress Notes (Signed)
Subjective:    Patient ID: Desiree Jackson, female    DOB: 1980-12-29, 30 y.o.   MRN: [id number]  HPI    Review of Systems     Objective:   Physical Exam        Assessment & Plan:   Subjective:    Desiree Jackson is a 30 y.o. female who presents for an annual exam. The patient has no complaints today. The patient is sexually active. GYN screening history: last pap: was normal. The patient wears seatbelts: yes. The patient participates in regular exercise: no. Has the patient ever been transfused or tattooed?: no. The patient reports that there is not domestic violence in her life.   Menstrual History: OB History    Grav Para Term Preterm Abortions TAB SAB Ect Mult Living   5 2 2  3  3   2       Menarche age: 7 No LMP recorded. Patient is not currently having periods (Reason: Lactating).    The following portions of the patient's history were reviewed and updated as appropriate: allergies, current medications, past family history, past medical history, past social history, past surgical history and problem list.  Review of Systems A comprehensive review of systems was negative.  She never took the Celexa and she denies depression. Objective:    BP 125/74  Pulse 73  Temp(Src) 98.6 F (37 C) (Oral)  Resp 16  Ht 5\' 2"  (1.575 m)  Wt 219 lb (99.338 kg)  BMI 40.06 kg/m2  Breastfeeding? Yes  General Appearance:    Alert, cooperative, no distress, appears stated age  Head:    Normocephalic, without obvious abnormality, atraumatic  Eyes:    PERRL, conjunctiva/corneas clear, EOM's intact, fundi    benign, both eyes  Ears:    Normal TM's and external ear canals, both ears  Nose:   Nares normal, septum midline, mucosa normal, no drainage    or sinus tenderness  Throat:   Lips, mucosa, and tongue normal; teeth and gums normal  Neck:   Supple, symmetrical, trachea midline, no adenopathy;    thyroid:  no enlargement/tenderness/nodules; no carotid   bruit or JVD    Back:     Symmetric, no curvature, ROM normal, no CVA tenderness  Lungs:     Clear to auscultation bilaterally, respirations unlabored  Chest Wall:    No tenderness or deformity   Heart:    Regular rate and rhythm, S1 and S2 normal, no murmur, rub   or gallop  Breast Exam:    No tenderness, masses, or nipple abnormality  Abdomen:     Soft, non-tender, bowel sounds active all four quadrants,    no masses, no organomegaly, obese  Genitalia:    Her vulva has scattered hypopigmented areas (consistent with lichen sclerosis) and she has a small black skin tag on her left labia majora   Uterus NSSA, NT, no adnexal masses  Extremities:   Extremities normal, atraumatic, no cyanosis or edema  Pulses:   2+ and symmetric all extremities  Skin:   Skin color, texture, turgor normal, no rashes or lesions  Lymph nodes:   Cervical, supraclavicular, and axillary nodes normal  Neurologic:   CNII-XII intact, normal strength, sensation and reflexes    throughout  .    Assessment:    Healthy female exam.  Vulvar skin changes   Plan:     Await pap smear results.  She will have a vulvar biopsy done in January when she gets her new  [employer] card.

## 2011-12-10 ENCOUNTER — Encounter: Payer: Self-pay | Admitting: Obstetrics & Gynecology

## 2011-12-10 ENCOUNTER — Ambulatory Visit (INDEPENDENT_AMBULATORY_CARE_PROVIDER_SITE_OTHER): Payer: Self-pay | Admitting: Obstetrics & Gynecology

## 2011-12-10 VITALS — BP 127/76 | HR 68 | Resp 16 | Ht 62.0 in | Wt 233.0 lb

## 2011-12-10 DIAGNOSIS — N9089 Other specified noninflammatory disorders of vulva and perineum: Secondary | ICD-10-CM

## 2011-12-10 MED ORDER — ESOMEPRAZOLE MAGNESIUM 20 MG PO PACK: 20.0000 mg | PACK | Freq: Every day | ORAL | Status: DC

## 2011-12-10 MED ORDER — ESOMEPRAZOLE MAGNESIUM 20 MG PO CPDR: 40.0000 mg | DELAYED_RELEASE_CAPSULE | Freq: Every day | ORAL | Status: DC

## 2011-12-10 NOTE — Progress Notes (Signed)
  Subjective:    Patient ID: Desiree Jackson, female    DOB: 1981/08/06, 31 y.o.   MRN: [id number]  HPI  Desiree Jackson was seen here 10/12 for her annual exam. At that time I noted vulvar changes c/w lichen sclerosis. She wanted to schedule the vulvar biopsy after her [employer] Sp[employer] was new (January 2013). She has recently noted some vulvar burning and itching. "It just feels bad."  Review of Systems Pap normal 10/12    Objective:   Physical Exam  Shaved vulva Changes from clitorus to anus c/w l.sclerosis  I prepped the left labia with betadine and anesthetized the area with 2 cc 1% lidocaine. I removed a small amount of vulvar skin at the crux between the left labia majora and minora. Silver nitrate was used to cauterize the area. She tolerated the procedure well.      Assessment & Plan:  Vulvar lesion- await biopsy

## 2011-12-12 ENCOUNTER — Ambulatory Visit: Payer: Medicaid Other | Admitting: Obstetrics & Gynecology

## 2011-12-25 ENCOUNTER — Ambulatory Visit (INDEPENDENT_AMBULATORY_CARE_PROVIDER_SITE_OTHER): Payer: Self-pay | Admitting: Obstetrics & Gynecology

## 2011-12-25 ENCOUNTER — Encounter: Payer: Self-pay | Admitting: Obstetrics & Gynecology

## 2011-12-25 VITALS — BP 132/90 | HR 82 | Temp 99.7°F | Resp 16 | Ht 62.0 in | Wt 234.0 lb

## 2011-12-25 DIAGNOSIS — L259 Unspecified contact dermatitis, unspecified cause: Secondary | ICD-10-CM

## 2011-12-25 DIAGNOSIS — L309 Dermatitis, unspecified: Secondary | ICD-10-CM

## 2011-12-25 MED ORDER — CLOBETASOL PROPIONATE 0.05 % EX CREA: TOPICAL_CREAM | CUTANEOUS | Status: DC

## 2011-12-25 NOTE — Progress Notes (Signed)
  Subjective:    Patient ID: Desiree Jackson, female    DOB: 1981/01/26, 31 y.o.   MRN: [id number]  HPI  Desiree Jackson is here today to discuss the biopsy results- lichen sclerosis. She is willing to do monthly vulvar exams due to the slight increase risk of vulvar cancer. She will try the temovate cream twice weekly and RTC in 6 months for a follow up visit.  Review of Systems     Objective:   Physical Exam        Assessment & Plan:

## 2012-01-24 ENCOUNTER — Other Ambulatory Visit (INDEPENDENT_AMBULATORY_CARE_PROVIDER_SITE_OTHER): Payer: Self-pay | Admitting: *Deleted

## 2012-01-24 DIAGNOSIS — L659 Nonscarring hair loss, unspecified: Secondary | ICD-10-CM

## 2012-01-24 DIAGNOSIS — R5381 Other malaise: Secondary | ICD-10-CM

## 2012-01-24 DIAGNOSIS — R5383 Other fatigue: Secondary | ICD-10-CM

## 2012-01-24 LAB — COMPREHENSIVE METABOLIC PANEL
ALT: 15 U/L (ref 0–35)
Albumin: 4.2 g/dL (ref 3.5–5.2)
CO2: 28 mEq/L (ref 19–32)
Calcium: 9.2 mg/dL (ref 8.4–10.5)
Chloride: 103 mEq/L (ref 96–112)
Glucose, Bld: 88 mg/dL (ref 70–99)
Sodium: 138 mEq/L (ref 135–145)
Total Protein: 6.9 g/dL (ref 6.0–8.3)

## 2012-01-24 LAB — FOLLICLE STIMULATING HORMONE: FSH: 5.2 m[IU]/mL

## 2012-01-24 NOTE — Progress Notes (Signed)
Pt here for fasting labs of CMP, FSH and TSH.  She is having increase in hair loss and fatigue.  Spoke with Dr Marice Potter and order was made for the labs and pt is scheduled to return to discuss the test results.

## 2012-01-29 ENCOUNTER — Ambulatory Visit (INDEPENDENT_AMBULATORY_CARE_PROVIDER_SITE_OTHER): Payer: Self-pay | Admitting: Obstetrics & Gynecology

## 2012-01-29 ENCOUNTER — Encounter: Payer: Self-pay | Admitting: Obstetrics & Gynecology

## 2012-01-29 VITALS — BP 131/76 | HR 74 | Temp 98.5°F | Resp 16 | Ht 62.0 in | Wt 234.0 lb

## 2012-01-29 DIAGNOSIS — E039 Hypothyroidism, unspecified: Secondary | ICD-10-CM

## 2012-01-29 MED ORDER — LEVOTHYROXINE SODIUM 25 MCG PO TABS: 25.0000 ug | ORAL_TABLET | Freq: Every day | ORAL | Status: DC

## 2012-01-29 NOTE — Progress Notes (Signed)
  Subjective:    Patient ID: Desiree Jackson, female    DOB: 1981-05-13, 32 y.o.   MRN: [id number]  HPI Desiree Jackson is here to discuss her TSH result (13). She gives a h/o a thyroid biopsy during her pregnancy.   Review of Systems     Objective:   Physical Exam        Assessment & Plan:  I will start synthroid at 25 mcg and increase gradually prn. RTC for TSH in 1 month.

## 2012-02-14 ENCOUNTER — Other Ambulatory Visit: Payer: Self-pay | Admitting: Obstetrics & Gynecology

## 2012-02-15 LAB — TSH: TSH: 5.605 u[IU]/mL — ABNORMAL HIGH (ref 0.350–4.500)

## 2012-02-18 ENCOUNTER — Telehealth: Payer: Self-pay | Admitting: *Deleted

## 2012-02-18 DIAGNOSIS — E039 Hypothyroidism, unspecified: Secondary | ICD-10-CM

## 2012-02-18 MED ORDER — LEVOTHYROXINE SODIUM 50 MCG PO TABS: 50.0000 ug | ORAL_TABLET | Freq: Every day | ORAL | Status: DC

## 2012-02-18 NOTE — Telephone Encounter (Signed)
TSH levels are still elevated @ 5.605  Spoke with Dr Marice Potter over the phone who ordered her Synthroid to be increased to daily.  Pt to have repeat TSH in 2 months.  Meds were escribed to [facility].

## 2012-03-27 DIAGNOSIS — E039 Hypothyroidism, unspecified: Secondary | ICD-10-CM

## 2012-03-27 HISTORY — DX: Hypothyroidism, unspecified: E03.9

## 2012-04-14 ENCOUNTER — Telehealth: Payer: Self-pay | Admitting: *Deleted

## 2012-04-14 DIAGNOSIS — E039 Hypothyroidism, unspecified: Secondary | ICD-10-CM

## 2012-04-14 NOTE — Telephone Encounter (Signed)
Pt to return for repeat TSH this week then wants RF on Synthroid

## 2012-04-15 ENCOUNTER — Other Ambulatory Visit: Payer: Self-pay | Admitting: *Deleted

## 2012-04-15 DIAGNOSIS — E039 Hypothyroidism, unspecified: Secondary | ICD-10-CM

## 2012-04-15 LAB — TSH: TSH: 2.738 u[IU]/mL (ref 0.350–4.500)

## 2012-04-15 MED ORDER — LEVOTHYROXINE SODIUM 50 MCG PO TABS: 50.0000 ug | ORAL_TABLET | Freq: Every day | ORAL | Status: DC

## 2012-04-15 NOTE — Telephone Encounter (Signed)
Pt notified of TSH WNL  RF for Synthroid sent to [facility].

## 2012-06-26 ENCOUNTER — Encounter: Payer: Self-pay | Admitting: Obstetrics & Gynecology

## 2012-06-26 ENCOUNTER — Ambulatory Visit (INDEPENDENT_AMBULATORY_CARE_PROVIDER_SITE_OTHER): Payer: Self-pay | Admitting: Obstetrics & Gynecology

## 2012-06-26 VITALS — BP 119/81 | HR 84 | Temp 98.6°F | Resp 16 | Ht 62.0 in | Wt 249.0 lb

## 2012-06-26 DIAGNOSIS — Z01419 Encounter for gynecological examination (general) (routine) without abnormal findings: Secondary | ICD-10-CM

## 2012-06-26 DIAGNOSIS — E039 Hypothyroidism, unspecified: Secondary | ICD-10-CM

## 2012-06-26 DIAGNOSIS — Z124 Encounter for screening for malignant neoplasm of cervix: Secondary | ICD-10-CM

## 2012-06-26 DIAGNOSIS — Z0000 Encounter for general adult medical examination without abnormal findings: Secondary | ICD-10-CM

## 2012-06-26 DIAGNOSIS — Z23 Encounter for immunization: Secondary | ICD-10-CM

## 2012-06-26 NOTE — Progress Notes (Signed)
Subjective:    Desiree Jackson is a 31 y.o. female who presents for an annual exam. The patient has no complaints today. She feels fatigued and would like a higher dose of synthroid.  The patient is sexually active. GYN screening history: last pap: was normal. The patient wears seatbelts: yes. The patient participates in regular exercise: yes. Has the patient ever been transfused or tattooed?: yes. The patient reports that there is not domestic violence in her life.   Menstrual History: OB History    Grav Para Term Preterm Abortions TAB SAB Ect Mult Living   5 2 2  3  3   2       Menarche age: 56 Patient's last menstrual period was 06/09/2012.    The following portions of the patient's history were reviewed and updated as appropriate: allergies, current medications, past family history, past medical history, past social history, past surgical history and problem list.  Review of Systems A comprehensive review of systems was negative. She is a Futures trader.   Objective:    BP 119/81  Pulse 84  Temp 98.6 F (37 C) (Oral)  Resp 16  Ht 5\' 2"  (1.575 m)  Wt 249 lb (112.946 kg)  BMI 45.54 kg/m2  LMP 06/09/2012  Breastfeeding? No  General Appearance:    Alert, cooperative, no distress, appears stated age  Head:    Normocephalic, without obvious abnormality, atraumatic  Eyes:    PERRL, conjunctiva/corneas clear, EOM's intact, fundi    benign, both eyes  Ears:    Normal TM's and external ear canals, both ears  Nose:   Nares normal, septum midline, mucosa normal, no drainage    or sinus tenderness  Throat:   Lips, mucosa, and tongue normal; teeth and gums normal  Neck:   Supple, symmetrical, trachea midline, no adenopathy;    thyroid:  no enlargement/tenderness/nodules; no carotid   bruit or JVD  Back:     Symmetric, no curvature, ROM normal, no CVA tenderness  Lungs:     Clear to auscultation bilaterally, respirations unlabored  Chest Wall:    No tenderness or deformity   Heart:     Regular rate and rhythm, S1 and S2 normal, no murmur, rub   or gallop  Breast Exam:    No tenderness, masses, or nipple abnormality  Abdomen:     Soft, non-tender, bowel sounds active all four quadrants,    no masses, no organomegaly  Genitalia:    Normal female without lesion, discharge or tenderness, NSSA, NT, no adnexal masses     Extremities:   Extremities normal, atraumatic, no cyanosis or edema  Pulses:   2+ and symmetric all extremities  Skin:   Skin color, texture, turgor normal, no rashes or lesions  Lymph nodes:   Cervical, supraclavicular, and axillary nodes normal  Neurologic:   CNII-XII intact, normal strength, sensation and reflexes    throughout  .    Assessment:    Healthy female exam.    Plan:     Thin prep Pap smear. fasting labs next month, along with TSH on 75 mcg of synthroid  Flu vaccine today

## 2012-06-30 ENCOUNTER — Telehealth: Payer: Self-pay | Admitting: *Deleted

## 2012-06-30 DIAGNOSIS — E039 Hypothyroidism, unspecified: Secondary | ICD-10-CM

## 2012-06-30 MED ORDER — LEVOTHYROXINE SODIUM 75 MCG PO TABS: 75.0000 ug | ORAL_TABLET | Freq: Every day | ORAL | Status: DC

## 2012-06-30 NOTE — Telephone Encounter (Signed)
Pt called stating that her RX for the change in Synthroid was not at the pharmacy and per Dr Lytle Michaels was sent to [facility].

## 2012-07-30 LAB — COMPREHENSIVE METABOLIC PANEL
ALT: 16 U/L (ref 0–35)
Albumin: 4.2 g/dL (ref 3.5–5.2)
Alkaline Phosphatase: 39 U/L (ref 39–117)
CO2: 27 mEq/L (ref 19–32)
Glucose, Bld: 66 mg/dL — ABNORMAL LOW (ref 70–99)
Potassium: 4.3 mEq/L (ref 3.5–5.3)
Sodium: 143 mEq/L (ref 135–145)
Total Bilirubin: 0.5 mg/dL (ref 0.3–1.2)
Total Protein: 6.9 g/dL (ref 6.0–8.3)

## 2012-07-30 LAB — LIPID PANEL
LDL Cholesterol: 74 mg/dL (ref 0–99)
VLDL: 37 mg/dL (ref 0–40)

## 2012-07-31 ENCOUNTER — Encounter: Payer: Self-pay | Admitting: Obstetrics & Gynecology

## 2012-07-31 ENCOUNTER — Ambulatory Visit (INDEPENDENT_AMBULATORY_CARE_PROVIDER_SITE_OTHER): Payer: Self-pay | Admitting: Obstetrics & Gynecology

## 2012-07-31 VITALS — BP 120/78 | HR 84 | Temp 98.4°F | Resp 16 | Ht 62.0 in | Wt 239.0 lb

## 2012-07-31 DIAGNOSIS — F32A Depression, unspecified: Secondary | ICD-10-CM

## 2012-07-31 DIAGNOSIS — Z8659 Personal history of other mental and behavioral disorders: Secondary | ICD-10-CM

## 2012-07-31 DIAGNOSIS — E039 Hypothyroidism, unspecified: Secondary | ICD-10-CM

## 2012-07-31 DIAGNOSIS — F3289 Other specified depressive episodes: Secondary | ICD-10-CM

## 2012-07-31 DIAGNOSIS — F329 Major depressive disorder, single episode, unspecified: Secondary | ICD-10-CM

## 2012-07-31 HISTORY — DX: Personal history of other mental and behavioral disorders: Z86.59

## 2012-07-31 MED ORDER — LEVOTHYROXINE SODIUM 100 MCG PO TABS: 100.0000 ug | ORAL_TABLET | Freq: Every day | ORAL | Status: DC

## 2012-07-31 MED ORDER — SERTRALINE HCL 50 MG PO TABS: 50.0000 mg | ORAL_TABLET | Freq: Every day | ORAL | Status: DC

## 2012-07-31 NOTE — Progress Notes (Signed)
  Subjective:    Patient ID: Desiree Jackson, female    DOB: Aug 05, 1981, 31 y.o.   MRN: [id number]  HPI  Pt is complaining of depression for 15 months.  She does not sleep well but thinks it is more due to her baby not sleeping.  She is very irritable.  She has gained a lot of weight and this makes her sad.  She is very fatigued.  Pt's TSH is >5.  Need to increase synthroid.  Pt denies suicidal or homicidal ideation.  Review of Systems  Constitutional: Positive for fatigue.  Respiratory: Negative.   Cardiovascular: Negative.   Gastrointestinal: Negative.   Genitourinary: Negative.        Objective:   Physical Exam  Vitals reviewed. Constitutional: She is oriented to person, place, and time. She appears well-developed and well-nourished. No distress.  HENT:  Head: Normocephalic and atraumatic.  Neurological: She is alert and oriented to person, place, and time.  Skin: Skin is warm and dry.  Psychiatric: Her behavior is normal. Judgment normal.          Assessment & Plan:  31 year old female with hypothyroid and depression  1-Increase synthroid to 100 mcg a day. 2-Start zoloft 50 mg 3-RTC 6 weeks 4-Will discuss heavy menses at next visit.

## 2012-08-27 DIAGNOSIS — K921 Melena: Secondary | ICD-10-CM

## 2012-08-27 HISTORY — DX: Melena: K92.1

## 2012-09-05 ENCOUNTER — Ambulatory Visit (HOSPITAL_BASED_OUTPATIENT_CLINIC_OR_DEPARTMENT_OTHER)
Admission: RE | Admit: 2012-09-05 | Discharge: 2012-09-05 | Disposition: A | Discharge status: Home / Self Care | Payer: BC Managed Care – PPO | Source: Ambulatory Visit | Attending: Physician Assistant | Admitting: Physician Assistant

## 2012-09-05 ENCOUNTER — Encounter: Payer: Self-pay | Admitting: Physician Assistant

## 2012-09-05 ENCOUNTER — Ambulatory Visit (INDEPENDENT_AMBULATORY_CARE_PROVIDER_SITE_OTHER): Payer: BC Managed Care – PPO | Admitting: Physician Assistant

## 2012-09-05 VITALS — BP 137/78 | HR 82 | Temp 98.1°F | Ht 61.5 in | Wt 239.0 lb

## 2012-09-05 DIAGNOSIS — R109 Unspecified abdominal pain: Secondary | ICD-10-CM

## 2012-09-05 DIAGNOSIS — R1032 Left lower quadrant pain: Secondary | ICD-10-CM

## 2012-09-05 DIAGNOSIS — R197 Diarrhea, unspecified: Secondary | ICD-10-CM

## 2012-09-05 DIAGNOSIS — K921 Melena: Secondary | ICD-10-CM

## 2012-09-05 DIAGNOSIS — R1031 Right lower quadrant pain: Secondary | ICD-10-CM

## 2012-09-05 LAB — COMPREHENSIVE METABOLIC PANEL
AST: 17 U/L (ref 0–37)
Albumin: 4.4 g/dL (ref 3.5–5.2)
Alkaline Phosphatase: 46 U/L (ref 39–117)
BUN: 11 mg/dL (ref 6–23)
Glucose, Bld: 95 mg/dL (ref 70–99)
Potassium: 4.2 mEq/L (ref 3.5–5.3)
Sodium: 139 mEq/L (ref 135–145)
Total Bilirubin: 0.5 mg/dL (ref 0.3–1.2)

## 2012-09-05 LAB — CBC WITH DIFFERENTIAL/PLATELET
Basophils Relative: 0 % (ref 0–1)
Eosinophils Absolute: 0.1 10*3/uL (ref 0.0–0.7)
Eosinophils Relative: 1 % (ref 0–5)
Hemoglobin: 14.6 g/dL (ref 12.0–15.0)
Lymphs Abs: 2.3 10*3/uL (ref 0.7–4.0)
MCH: 29 pg (ref 26.0–34.0)
MCHC: 34.8 g/dL (ref 30.0–36.0)
MCV: 83.1 fL (ref 78.0–100.0)
Monocytes Absolute: 0.5 10*3/uL (ref 0.1–1.0)
Monocytes Relative: 5 % (ref 3–12)
RBC: 5.04 MIL/uL (ref 3.87–5.11)

## 2012-09-05 MED ORDER — IOHEXOL 300 MG/ML  SOLN
100.0000 mL | Freq: Once | INTRAMUSCULAR | Status: AC | PRN
  Administered 2012-09-05: 100 mL via INTRAVENOUS

## 2012-09-05 MED ORDER — IOHEXOL 300 MG/ML  SOLN: 20.0000 mL | INTRAMUSCULAR | Status: AC

## 2012-09-05 MED ORDER — IOHEXOL 300 MG/ML  SOLN
50.0000 mL | Freq: Once | INTRAMUSCULAR | Status: AC | PRN
  Administered 2012-09-05: 50 mL via ORAL

## 2012-09-05 NOTE — Patient Instructions (Addendum)
Go for imaging. If positive for any of these need to go to ER.   Dehydration, Adult Dehydration is when you lose more fluids from the body than you take in. Vital organs like the kidneys, brain, and heart cannot function without a proper amount of fluids and salt. Any loss of fluids from the body can cause dehydration.  CAUSES   Vomiting.  Diarrhea.  Excessive sweating.  Excessive urine output.  Fever. SYMPTOMS  Mild dehydration  Thirst.  Dry lips.  Slightly dry mouth. Moderate dehydration  Very dry mouth.  Sunken eyes.  Skin does not bounce back quickly when lightly pinched and released.  Dark urine and decreased urine production.  Decreased tear production.  Headache. Severe dehydration  Very dry mouth.  Extreme thirst.  Rapid, weak pulse (more than 100 beats per minute at rest).  Cold hands and feet.  Not able to sweat in spite of heat and temperature.  Rapid breathing.  Blue lips.  Confusion and lethargy.  Difficulty being awakened.  Minimal urine production.  No tears. DIAGNOSIS  Your caregiver will diagnose dehydration based on your symptoms and your exam. Blood and urine tests will help confirm the diagnosis. The diagnostic evaluation should also identify the cause of dehydration. TREATMENT  Treatment of mild or moderate dehydration can often be done at home by increasing the amount of fluids that you drink. It is best to drink small amounts of fluid more often. Drinking too much at one time can make vomiting worse. Refer to the home care instructions below. Severe dehydration needs to be treated at the hospital where you will probably be given intravenous (IV) fluids that contain water and electrolytes. HOME CARE INSTRUCTIONS   Ask your caregiver about specific rehydration instructions.  Drink enough fluids to keep your urine clear or pale yellow.  Drink small amounts frequently if you have nausea and vomiting.  Eat as you normally  do.  Avoid:  Foods or drinks high in sugar.  Carbonated drinks.  Juice.  Extremely hot or cold fluids.  Drinks with caffeine.  Fatty, greasy foods.  Alcohol.  Tobacco.  Overeating.  Gelatin desserts.  Wash your hands well to avoid spreading bacteria and viruses.  Only take over-the-counter or prescription medicines for pain, discomfort, or fever as directed by your caregiver.  Ask your caregiver if you should continue all prescribed and over-the-counter medicines.  Keep all follow-up appointments with your caregiver. SEEK MEDICAL CARE IF:  You have abdominal pain and it increases or stays in one area (localizes).  You have a rash, stiff neck, or severe headache.  You are irritable, sleepy, or difficult to awaken.  You are weak, dizzy, or extremely thirsty. SEEK IMMEDIATE MEDICAL CARE IF:   You are unable to keep fluids down or you get worse despite treatment.  You have frequent episodes of vomiting or diarrhea.  You have blood or green matter (bile) in your vomit.  You have blood in your stool or your stool looks black and tarry.  You have not urinated in 6 to 8 hours, or you have only urinated a small amount of very dark urine.  You have a fever.  You faint. MAKE SURE YOU:   Understand these instructions.  Will watch your condition.  Will get help right away if you are not doing well or get worse. Document Released: 08/13/2005 Document Revised: 11/05/2011 Document Reviewed: 04/02/2011 [facility] Patient Information 2013 [place], [place].

## 2012-09-05 NOTE — Progress Notes (Signed)
  Subjective:    Patient ID: Desiree Jackson, female    DOB: 10/08/1980, 32 y.o.   MRN: [id number]  HPI Patient is a 32 yo female who presents to the clinic to establish care and to address GI concerns. PMH reviewed and positive for hypothyroidism, and GERD.  Hypothyroidism is controlled and managed by her OB/GYN. Denies any symptoms or cold/heat intolerances, skin changes, fatigue.  GERD well controlled with nexium. Diet sometimes causes to be worse but easily corrected.   New GI symptoms started Tuesday(3 days ago) with lower right abdominal pain, bloody diarrhea. Reports she has been going 10-15 times a day. Describes blood as bright red and with some mucus. Has had some nausea but no vomiting. Has had gallbladder removed. Denies any recent travel, abx. Family has eaten the same things as her and noone else is sick. Drinks city water and no exposure to lake. Denies any pain with urination. Does seem to be worse after eating. Taking tylenol with no relief. Denies any fever, chills, muscle aches. No skin issues. She has had some dizziness. No vomiting. Has not blacked out or fallen.       Review of Systems     Objective:   Physical Exam  Constitutional: She is oriented to person, place, and time. She appears well-developed and well-nourished.  HENT:  Head: Normocephalic and atraumatic.       Mucosa still moist.  Cardiovascular: Normal rate, regular rhythm and normal heart sounds.   Pulmonary/Chest: Effort normal and breath sounds normal.  Abdominal: Soft. Bowel sounds are normal. She exhibits distension. She exhibits no mass. There is tenderness. There is guarding. There is no rebound.       All tenderness in RLQ with palpation. Tenderness also up right side into back.   Genitourinary: Guaiac positive stool.  Neurological: She is alert and oriented to person, place, and time.  Skin:       Cheeks flushed.good skin tugor.  Psychiatric: She has a normal mood and affect. Her  behavior is normal.          Assessment & Plan:  Right lower abdominal pain/Right flank pain/diarrhea- Not able to urinate in office and give urine sample to evaluate for kidney infection. Concerned with so much tenderness over RLQ might be appendicitis. Will get CT of abdomen and pelvis. Encouraged pt not to treat right now since blood and mucus present. If CT does not reveal anything will get GI referral. Continue to stay hydrated. Gave Handout for dehydration. Pt aware if show signs to go to ER.

## 2012-09-08 ENCOUNTER — Other Ambulatory Visit: Payer: BC Managed Care – PPO

## 2012-09-08 ENCOUNTER — Other Ambulatory Visit: Payer: Self-pay | Admitting: Physician Assistant

## 2012-09-08 DIAGNOSIS — K921 Melena: Secondary | ICD-10-CM

## 2012-09-08 DIAGNOSIS — R1031 Right lower quadrant pain: Secondary | ICD-10-CM

## 2012-09-08 DIAGNOSIS — R197 Diarrhea, unspecified: Secondary | ICD-10-CM

## 2012-09-08 DIAGNOSIS — E039 Hypothyroidism, unspecified: Secondary | ICD-10-CM

## 2012-09-08 NOTE — Progress Notes (Signed)
Pt aware and is coming to do stool sample today.

## 2012-09-09 ENCOUNTER — Encounter: Payer: Self-pay | Admitting: Obstetrics & Gynecology

## 2012-09-09 ENCOUNTER — Ambulatory Visit (INDEPENDENT_AMBULATORY_CARE_PROVIDER_SITE_OTHER): Payer: BC Managed Care – PPO | Admitting: Obstetrics & Gynecology

## 2012-09-09 VITALS — BP 132/84 | HR 84 | Resp 16 | Ht 62.0 in | Wt 243.0 lb

## 2012-09-09 DIAGNOSIS — N920 Excessive and frequent menstruation with regular cycle: Secondary | ICD-10-CM

## 2012-09-09 MED ORDER — LEVOTHYROXINE SODIUM 100 MCG PO TABS: 125.0000 ug | ORAL_TABLET | Freq: Every day | ORAL | Status: DC

## 2012-09-09 MED ORDER — LEVOTHYROXINE SODIUM 125 MCG PO TABS: 125.0000 ug | ORAL_TABLET | Freq: Every day | ORAL | Status: DC

## 2012-09-09 NOTE — Progress Notes (Signed)
  Subjective:     Desiree Jackson is a 32 y.o. woman who presents for irregular menses. Patient's last menstrual period was 08/24/2012.Marland Kitchen Periods are irregular, lasting 7-14 days days. Dysmenorrhea:severe, occurring throughout menses. Cyclic symptoms include: bloating, depression, irritability and pelvic pain. Current contraception: tubal ligation.History of infertility: no. History of abnormal Pap smear: no.  The following portions of the patient's history were reviewed and updated as appropriate: allergies, current medications, past family history, past medical history, past social history, past surgical history and problem list.  Review of Systems Pertinent items are noted in HPI.     Objective:    No exam performed today, Will repeat exam at time of endometrial biopsy.    Assessment:    The patient has menometrorrhagia.    Plan:    Diagnosis explained in detail. All questions answered. Pelvic ultrasound. Needs endometrial biopsy at next visit  TSH still high.  Will increase synthroid to 125 mcg.  If not nml in 6 weeks, will refer to primary care or endocring. Pt did not take zoloft--hates meds Pt does not want to take depo or OCPs--they make her feel bad Hgb nml (pt is on PNV with iron--still breast feeding).

## 2012-09-09 NOTE — Patient Instructions (Signed)
Endometrial Biopsy  Take 800mg  of ibuprofen before procedure.  This is a test in which a tissue sample (a biopsy) is taken from inside the uterus (womb). It is then looked at by a specialist under a microscope to see if the tissue is normal or abnormal. The endometrium is the lining of the uterus. This test helps determine where you are in your menstrual cycle and how hormone levels are affecting the lining of the uterus. Another use for this test is to diagnose endometrial cancer, tuberculosis, polyps, or inflammatory conditions and to evaluate uterine bleeding. PREPARATION FOR TEST No preparation or fasting is necessary. NORMAL FINDINGS No pathologic conditions. Presence of "secretory-type" endometrium 3 to 5 days before to normal menstruation. Ranges for normal findings may vary among different laboratories and hospitals. You should always check with your doctor after having lab work or other tests done to discuss the meaning of your test results and whether your values are considered within normal limits. MEANING OF TEST  Your caregiver will go over the test results with you and discuss the importance and meaning of your results, as well as treatment options and the need for additional tests if necessary. OBTAINING THE TEST RESULTS It is your responsibility to obtain your test results. Ask the lab or department performing the test when and how you will get your results. Document Released: 12/14/2004 Document Revised: 11/05/2011 Document Reviewed: 07/23/2008 [facility] Patient Information 2013 [place], [place].

## 2012-09-10 ENCOUNTER — Encounter: Payer: Self-pay | Admitting: *Deleted

## 2012-09-10 LAB — CLOSTRIDIUM DIFFICILE EIA: CDIFTX: NEGATIVE

## 2012-09-11 ENCOUNTER — Other Ambulatory Visit: Payer: BC Managed Care – PPO

## 2012-09-11 LAB — OVA AND PARASITE EXAMINATION: OP: NONE SEEN

## 2012-09-12 ENCOUNTER — Ambulatory Visit (INDEPENDENT_AMBULATORY_CARE_PROVIDER_SITE_OTHER): Payer: BC Managed Care – PPO

## 2012-09-12 ENCOUNTER — Other Ambulatory Visit: Payer: Self-pay | Admitting: Physician Assistant

## 2012-09-12 DIAGNOSIS — D259 Leiomyoma of uterus, unspecified: Secondary | ICD-10-CM

## 2012-09-12 DIAGNOSIS — N920 Excessive and frequent menstruation with regular cycle: Secondary | ICD-10-CM

## 2012-09-12 DIAGNOSIS — R197 Diarrhea, unspecified: Secondary | ICD-10-CM

## 2012-09-12 DIAGNOSIS — K921 Melena: Secondary | ICD-10-CM

## 2012-09-13 LAB — STOOL CULTURE

## 2012-09-16 ENCOUNTER — Encounter: Payer: Self-pay | Admitting: Internal Medicine

## 2012-09-23 ENCOUNTER — Encounter: Payer: Self-pay | Admitting: Family Medicine

## 2012-09-23 ENCOUNTER — Ambulatory Visit (INDEPENDENT_AMBULATORY_CARE_PROVIDER_SITE_OTHER)
Admission: RE | Admit: 2012-09-23 | Discharge: 2012-09-23 | Disposition: A | Discharge status: Home / Self Care | Payer: BC Managed Care – PPO | Source: Ambulatory Visit | Attending: Family Medicine | Admitting: Family Medicine

## 2012-09-23 ENCOUNTER — Ambulatory Visit (INDEPENDENT_AMBULATORY_CARE_PROVIDER_SITE_OTHER): Payer: BC Managed Care – PPO | Admitting: Family Medicine

## 2012-09-23 VITALS — BP 113/70 | HR 70 | Temp 98.4°F | Resp 16 | Ht 62.0 in | Wt 240.0 lb

## 2012-09-23 DIAGNOSIS — E039 Hypothyroidism, unspecified: Secondary | ICD-10-CM

## 2012-09-23 DIAGNOSIS — K5900 Constipation, unspecified: Secondary | ICD-10-CM

## 2012-09-23 NOTE — Assessment & Plan Note (Signed)
KUB here today: f/u result ASAP. Start metamucil once daily and start generic OTC senakot S, 2 tabs qhs. If this is not helping much in the next 4-6 days, will add miralax. Although it is not clear that these meds are excreted in breast milk, I did caution pt to watch for changes in her baby while taking these, esp regarding n/v/d or excessive fussiness/gas after breast feeding.

## 2012-09-23 NOTE — Patient Instructions (Signed)
Buy metamucil and take one full dose every day. Buy OTC generic senakot S and take 2 tabs every evening.

## 2012-09-23 NOTE — Assessment & Plan Note (Signed)
Recheck TSH in 4-6 wks to see how the up-titration of her dose to 125 mcg two weeks ago affected her.
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
## 2012-09-25 ENCOUNTER — Encounter: Payer: Self-pay | Admitting: Obstetrics & Gynecology

## 2012-09-25 ENCOUNTER — Ambulatory Visit (INDEPENDENT_AMBULATORY_CARE_PROVIDER_SITE_OTHER): Payer: BC Managed Care – PPO | Admitting: Obstetrics & Gynecology

## 2012-09-25 VITALS — BP 119/78 | HR 76 | Temp 97.6°F | Resp 16 | Ht 62.0 in | Wt 240.0 lb

## 2012-09-25 DIAGNOSIS — Z01812 Encounter for preprocedural laboratory examination: Secondary | ICD-10-CM

## 2012-09-25 DIAGNOSIS — N920 Excessive and frequent menstruation with regular cycle: Secondary | ICD-10-CM

## 2012-09-25 NOTE — Patient Instructions (Signed)
Place endometrial biopsy patient instructions here.  

## 2012-09-25 NOTE — Progress Notes (Signed)
  Endometrial Biopsy Procedure Note  Pre-operative Diagnosis: Abnml uterine bleeding  Post-operative Diagnosis: same  Indications: abnormal uterine bleeding  Procedure Details   Urine pregnancy test was done today and result was negative.  The risks (including infection, bleeding, pain, and uterine perforation) and benefits of the procedure were explained to the patient and Written informed consent was obtained.  Antibiotic prophylaxis against endocarditis was not indicated.   The patient was placed in the dorsal lithotomy position.  Bimanual exam showed the uterus to be in the retroflexed position.  A Graves' speculum inserted in the vagina, and the cervix prepped with povidone iodine.  Endocervical curettage with a Kevorkian curette was not performed.   A Pipelle endometrial aspirator was used to sample the endometrium.  Uterus sounded to 10 cm.  Sample was sent for pathologic examination.  Condition: Stable  Complications: None  Plan:  The patient was advised to call for any fever or for prolonged or severe pain or bleeding. She was advised to use NSAID as needed for mild to moderate pain. She was advised to avoid vaginal intercourse for 48 hours or until the bleeding has completely stopped.  Attending Physician Documentation: I was present for or participated in the entire procedure, including opening and closing.

## 2012-10-08 ENCOUNTER — Ambulatory Visit (INDEPENDENT_AMBULATORY_CARE_PROVIDER_SITE_OTHER): Payer: BC Managed Care – PPO | Admitting: Obstetrics & Gynecology

## 2012-10-08 ENCOUNTER — Encounter: Payer: Self-pay | Admitting: Obstetrics & Gynecology

## 2012-10-08 VITALS — BP 119/75 | HR 84 | Resp 16 | Ht 62.0 in | Wt 238.0 lb

## 2012-10-08 DIAGNOSIS — R3989 Other symptoms and signs involving the genitourinary system: Secondary | ICD-10-CM

## 2012-10-08 NOTE — Progress Notes (Signed)
  Subjective:    Patient ID: Desiree Jackson, female    DOB: 04/05/1981, 32 y.o.   MRN: [id number]  HPI  Pt presents for laln of bleeding and dyspareunia.  Pt's last menses in January was 12 days long.  US shows none 2 cm anterior fibroid, biopsy is negative.  She has had painful intercourse for years unless she is pregnant, when pain goes away.  She has pain in rectum at time of menses. Pt urinates q 2 hours.  She odes have pain with holding urine.  Pt has some leakage of urine. Pt would like hysterectomy.  Use does not do well with hormonal contraception and does not want an ablation.  Review of Systems  Constitutional: Negative.   Respiratory: Negative.   Cardiovascular: Negative.   Endocrine: Positive for cold intolerance.  Genitourinary: Positive for menstrual problem and dyspareunia.  Hematological: Negative.   Psychiatric/Behavioral: Negative.        Objective:   Physical Exam  Vitals reviewed. Constitutional: She is oriented to person, place, and time. She appears well-developed and well-nourished.  HENT:  Head: Normocephalic and atraumatic.  Eyes: Conjunctivae are normal.  Abdominal: Soft. She exhibits no distension. There is no tenderness.  Genitourinary: Vagina normal.  Little descent of uterus with valsalva, bladder does descend with valsalva.  Vary tender over bladder during bimanual.  No CMT, no adnexal pain or mass but limited by habitus.  Musculoskeletal: Normal range of motion.  Neurological: She is alert and oriented to person, place, and time.  Skin: Skin is warm and dry.  Psychiatric: She has a normal mood and affect.          Assessment & Plan:  32 yo female with menorrhagia, incontinence, bladder pain, and dyspareunia.  1-Referral to urology for r/o interstitial cystitis. 2-Body habitus would prove for difficult straight stick lsc hysterectomy.  Pt might be candidate for robot.  Will refer to dr. Marice Potter and see if she finds her a suitable candidate.   Can also address any bladder prolapse and incontinence issues. 3-f/u with primary care ofr hypothyroid 4-Pt still breast feeding at 17 months

## 2012-10-09 ENCOUNTER — Ambulatory Visit: Payer: BC Managed Care – PPO | Admitting: Obstetrics & Gynecology

## 2012-10-09 ENCOUNTER — Ambulatory Visit: Payer: BC Managed Care – PPO | Admitting: Internal Medicine

## 2012-10-16 ENCOUNTER — Telehealth: Payer: Self-pay

## 2012-10-16 NOTE — Telephone Encounter (Signed)
Pt is scheduled to see [facility] [phone] on March 11 at 10 am.

## 2012-10-30 ENCOUNTER — Encounter: Payer: Self-pay | Admitting: Family Medicine

## 2012-10-30 ENCOUNTER — Ambulatory Visit (INDEPENDENT_AMBULATORY_CARE_PROVIDER_SITE_OTHER): Payer: BC Managed Care – PPO | Admitting: Family Medicine

## 2012-10-30 VITALS — BP 112/70 | HR 78 | Temp 98.5°F | Ht 62.0 in | Wt 244.0 lb

## 2012-10-30 DIAGNOSIS — IMO0001 Reserved for inherently not codable concepts without codable children: Secondary | ICD-10-CM | POA: Insufficient documentation

## 2012-10-30 DIAGNOSIS — R209 Unspecified disturbances of skin sensation: Secondary | ICD-10-CM

## 2012-10-30 DIAGNOSIS — E039 Hypothyroidism, unspecified: Secondary | ICD-10-CM

## 2012-10-30 LAB — CBC WITH DIFFERENTIAL/PLATELET
Basophils Relative: 0.4 % (ref 0.0–3.0)
Eosinophils Relative: 1.1 % (ref 0.0–5.0)
HCT: 41.1 % (ref 36.0–46.0)
Hemoglobin: 13.7 g/dL (ref 12.0–15.0)
Lymphs Abs: 2.3 10*3/uL (ref 0.7–4.0)
MCV: 85.6 fl (ref 78.0–100.0)
Monocytes Absolute: 0.4 10*3/uL (ref 0.1–1.0)
Neutro Abs: 5.9 10*3/uL (ref 1.4–7.7)
RBC: 4.81 Mil/uL (ref 3.87–5.11)
WBC: 8.7 10*3/uL (ref 4.5–10.5)

## 2012-10-30 MED ORDER — ESOMEPRAZOLE MAGNESIUM 40 MG PO CPDR: 40.0000 mg | DELAYED_RELEASE_CAPSULE | Freq: Every day | ORAL | Status: DC
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
## 2012-10-30 NOTE — Assessment & Plan Note (Signed)
Due for TSH today. Current dosing 125 mcg qd for the last 6-7 wks.

## 2012-10-30 NOTE — Assessment & Plan Note (Signed)
Raynaud's dz possible but question peripheral neuropathy. Will check vit B12/MMA levels and folate level today. Will go ahead and ask neurology to see her for further e/m of this problem.

## 2012-12-04 ENCOUNTER — Other Ambulatory Visit: Payer: Self-pay | Admitting: *Deleted

## 2012-12-04 MED ORDER — LEVOTHYROXINE SODIUM 125 MCG PO TABS: 125.0000 ug | ORAL_TABLET | Freq: Every day | ORAL | Status: DC

## 2012-12-16 ENCOUNTER — Ambulatory Visit (INDEPENDENT_AMBULATORY_CARE_PROVIDER_SITE_OTHER): Payer: BC Managed Care – PPO | Admitting: Obstetrics & Gynecology

## 2012-12-16 ENCOUNTER — Encounter: Payer: Self-pay | Admitting: Obstetrics & Gynecology

## 2012-12-16 VITALS — BP 114/73 | HR 76 | Resp 16 | Ht 62.0 in | Wt 243.0 lb

## 2012-12-16 DIAGNOSIS — IMO0002 Reserved for concepts with insufficient information to code with codable children: Secondary | ICD-10-CM

## 2012-12-16 DIAGNOSIS — N920 Excessive and frequent menstruation with regular cycle: Secondary | ICD-10-CM

## 2012-12-16 DIAGNOSIS — N946 Dysmenorrhea, unspecified: Secondary | ICD-10-CM

## 2012-12-16 NOTE — Progress Notes (Signed)
  Subjective:    Patient ID: Desiree Jackson, female    DOB: 04/23/1981, 32 y.o.   MRN: [id number]  HPI  Desiree Jackson is a 32 yo P2 s/p BTL who is here today to discuss a robotic hysterectomy. She and Dr. Penne Lash have decided that a hysterectomy is her best course of treatment. I have discussed her dysmenorrhea and the fate of her ovaries. We have agreed that if I see endometriosis during the laparoscopy, then I will remove her ovaries. She understands that she will be menopausal and may or may not want ERT. We have discussed her cervix as well. She will do some research and soul searching and let me know on the day of surgery what she would like me to do with her cervix.  Review of Systems     Objective:   Physical Exam        Assessment & Plan:  Dysmenorrhea and DUB- I will send [place] an email to schedule her for a robotic hysterectomy. She agrees to do a bowel prep before surgery. She understands the risks and benefits of surgery.

## 2012-12-18 ENCOUNTER — Other Ambulatory Visit: Payer: Self-pay | Admitting: *Deleted

## 2012-12-18 DIAGNOSIS — L900 Lichen sclerosus et atrophicus: Secondary | ICD-10-CM

## 2012-12-18 MED ORDER — CLOBETASOL PROPIONATE 0.05 % EX CREA: TOPICAL_CREAM | CUTANEOUS | Status: DC

## 2012-12-18 NOTE — Telephone Encounter (Signed)
Pt called requesting a RF on her Clobetasol cream.  Per Dr Marice Potter may RF as she saw her in office on Tuesday.

## 2013-02-01 ENCOUNTER — Emergency Department
Admission: EM | Admit: 2013-02-01 | Discharge: 2013-02-01 | Disposition: A | Discharge status: Home / Self Care | Payer: BC Managed Care – PPO | Source: Home / Self Care | Attending: Family Medicine | Admitting: Family Medicine

## 2013-02-01 DIAGNOSIS — L03119 Cellulitis of unspecified part of limb: Secondary | ICD-10-CM

## 2013-02-01 DIAGNOSIS — L02416 Cutaneous abscess of left lower limb: Secondary | ICD-10-CM

## 2013-02-01 DIAGNOSIS — L02419 Cutaneous abscess of limb, unspecified: Secondary | ICD-10-CM

## 2013-02-01 MED ORDER — DOXYCYCLINE HYCLATE 100 MG PO CAPS: 100.0000 mg | ORAL_CAPSULE | Freq: Two times a day (BID) | ORAL | Status: DC

## 2013-02-01 MED ORDER — ONDANSETRON HCL 4 MG PO TABS: 4.0000 mg | ORAL_TABLET | Freq: Four times a day (QID) | ORAL | Status: DC
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
## 2013-02-01 NOTE — ED Notes (Signed)
Desiree Jackson complains of swollen and red area on the upper part of her left leg. Denies fever, chills or sweats.

## 2013-02-02 ENCOUNTER — Encounter: Payer: Self-pay | Admitting: *Deleted

## 2013-02-02 ENCOUNTER — Emergency Department (INDEPENDENT_AMBULATORY_CARE_PROVIDER_SITE_OTHER)
Admission: EM | Admit: 2013-02-02 | Discharge: 2013-02-02 | Disposition: A | Discharge status: Home / Self Care | Payer: BC Managed Care – PPO | Source: Home / Self Care | Attending: Family Medicine | Admitting: Family Medicine

## 2013-02-02 DIAGNOSIS — L732 Hidradenitis suppurativa: Secondary | ICD-10-CM

## 2013-02-02 NOTE — ED Notes (Signed)
Patient here for f/u on wound.
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
## 2013-02-04 LAB — WOUND CULTURE
Gram Stain: NONE SEEN
Gram Stain: NONE SEEN

## 2013-03-12 ENCOUNTER — Encounter (HOSPITAL_COMMUNITY): Payer: Self-pay | Admitting: Pharmacy Technician

## 2013-03-17 ENCOUNTER — Encounter (HOSPITAL_COMMUNITY): Payer: Self-pay

## 2013-03-17 ENCOUNTER — Encounter (HOSPITAL_COMMUNITY)
Admission: RE | Admit: 2013-03-17 | Discharge: 2013-03-17 | Disposition: A | Discharge status: Home / Self Care | Payer: BC Managed Care – PPO | Source: Ambulatory Visit | Attending: Obstetrics & Gynecology | Admitting: Obstetrics & Gynecology

## 2013-03-17 HISTORY — DX: Nausea with vomiting, unspecified: R11.2

## 2013-03-17 HISTORY — DX: Headache: R51

## 2013-03-17 HISTORY — DX: Other specified postprocedural states: Z98.890

## 2013-03-17 LAB — CBC
Platelets: 339 10*3/uL (ref 150–400)
RBC: 4.85 MIL/uL (ref 3.87–5.11)
RDW: 13.4 % (ref 11.5–15.5)
WBC: 9 10*3/uL (ref 4.0–10.5)

## 2013-03-17 NOTE — Patient Instructions (Signed)
Your procedure is scheduled on:03/19/13  Enter through the Main Entrance at :6am Pick up desk phone and dial [phone] and inform us of your arrival.  Please call [phone] if you have any problems the morning of surgery.  Remember: Do not eat food or drink liquids, including water, after midnight: WE   You may brush your teeth the morning of surgery.  Take these meds the morning of surgery with a sip of water: Synthroid, Nexium  DO NOT wear jewelry, eye make-up, lipstick,body lotion, or dark fingernail polish.  (Polished toes are ok) You may wear deodorant.  If you are to be admitted after surgery, leave suitcase in car until your room has been assigned. Patients discharged on the day of surgery will not be allowed to drive home. Wear loose fitting, comfortable clothes for your ride home.

## 2013-03-19 ENCOUNTER — Encounter (HOSPITAL_COMMUNITY): Payer: Self-pay | Admitting: Anesthesiology

## 2013-03-19 ENCOUNTER — Ambulatory Visit (HOSPITAL_COMMUNITY): Payer: BC Managed Care – PPO | Admitting: Anesthesiology

## 2013-03-19 ENCOUNTER — Encounter (HOSPITAL_COMMUNITY): Payer: Self-pay | Admitting: *Deleted

## 2013-03-19 ENCOUNTER — Ambulatory Visit (HOSPITAL_COMMUNITY)
Admission: RE | Admit: 2013-03-19 | Discharge: 2013-03-19 | Disposition: A | Discharge status: Home / Self Care | Payer: BC Managed Care – PPO | Source: Ambulatory Visit | Attending: Obstetrics & Gynecology | Admitting: Obstetrics & Gynecology

## 2013-03-19 ENCOUNTER — Encounter (HOSPITAL_COMMUNITY)
Admission: RE | Disposition: A | Discharge status: Home / Self Care | Payer: Self-pay | Source: Ambulatory Visit | Attending: Obstetrics & Gynecology

## 2013-03-19 DIAGNOSIS — IMO0002 Reserved for concepts with insufficient information to code with codable children: Secondary | ICD-10-CM

## 2013-03-19 DIAGNOSIS — R102 Pelvic and perineal pain: Secondary | ICD-10-CM

## 2013-03-19 DIAGNOSIS — N803 Endometriosis of pelvic peritoneum, unspecified: Secondary | ICD-10-CM

## 2013-03-19 DIAGNOSIS — N920 Excessive and frequent menstruation with regular cycle: Secondary | ICD-10-CM

## 2013-03-19 DIAGNOSIS — N949 Unspecified condition associated with female genital organs and menstrual cycle: Secondary | ICD-10-CM

## 2013-03-19 HISTORY — PX: CYSTOSCOPY: SHX5120

## 2013-03-19 HISTORY — PX: ROBOTIC ASSISTED TOTAL HYSTERECTOMY: SHX6085

## 2013-03-19 LAB — PREGNANCY, URINE: Preg Test, Ur: NEGATIVE

## 2013-03-19 SURGERY — ROBOTIC ASSISTED TOTAL HYSTERECTOMY
Anesthesia: General | Site: Urethra | Wound class: Clean Contaminated

## 2013-03-19 MED ORDER — LACTATED RINGERS IR SOLN
Status: DC | PRN
  Administered 2013-03-19: 3000 mL

## 2013-03-19 MED ORDER — ACETAMINOPHEN 10 MG/ML IV SOLN
1000.0000 mg | Freq: Once | INTRAVENOUS | Status: DC
  Filled 2013-03-19: qty 100

## 2013-03-19 MED ORDER — CEFAZOLIN SODIUM-DEXTROSE 2-3 GM-% IV SOLR: 2.0000 g | INTRAVENOUS | Status: DC

## 2013-03-19 MED ORDER — KETOROLAC TROMETHAMINE 30 MG/ML IJ SOLN
INTRAMUSCULAR | Status: AC
  Filled 2013-03-19: qty 1

## 2013-03-19 MED ORDER — INDIGOTINDISULFONATE SODIUM 8 MG/ML IJ SOLN
INTRAMUSCULAR | Status: DC | PRN
  Administered 2013-03-19: 3 mL via INTRAVENOUS

## 2013-03-19 MED ORDER — KETOROLAC TROMETHAMINE 30 MG/ML IJ SOLN
15.0000 mg | Freq: Once | INTRAMUSCULAR | Status: AC | PRN
  Administered 2013-03-19: 30 mg via INTRAVENOUS

## 2013-03-19 MED ORDER — ACETAMINOPHEN 10 MG/ML IV SOLN
INTRAVENOUS | Status: DC | PRN
  Administered 2013-03-19: 1000 mg via INTRAVENOUS

## 2013-03-19 MED ORDER — OXYCODONE-ACETAMINOPHEN 5-325 MG PO TABS
1.0000 | ORAL_TABLET | ORAL | Status: DC | PRN
  Administered 2013-03-19: 2 via ORAL
  Filled 2013-03-19: qty 2

## 2013-03-19 MED ORDER — FENTANYL CITRATE 0.05 MG/ML IJ SOLN
INTRAMUSCULAR | Status: DC | PRN
  Administered 2013-03-19: 50 ug via INTRAVENOUS
  Administered 2013-03-19 (×2): 100 ug via INTRAVENOUS
  Administered 2013-03-19 (×2): 50 ug via INTRAVENOUS

## 2013-03-19 MED ORDER — ROCURONIUM BROMIDE 50 MG/5ML IV SOLN
INTRAVENOUS | Status: AC
  Filled 2013-03-19: qty 2

## 2013-03-19 MED ORDER — FENTANYL CITRATE 0.05 MG/ML IJ SOLN
25.0000 ug | INTRAMUSCULAR | Status: DC | PRN
  Administered 2013-03-19: 50 ug via INTRAVENOUS

## 2013-03-19 MED ORDER — DEXAMETHASONE SODIUM PHOSPHATE 10 MG/ML IJ SOLN
INTRAMUSCULAR | Status: DC | PRN
  Administered 2013-03-19: 10 mg via INTRAVENOUS

## 2013-03-19 MED ORDER — FENTANYL CITRATE 0.05 MG/ML IJ SOLN
INTRAMUSCULAR | Status: AC
  Administered 2013-03-19: 50 ug via INTRAVENOUS
  Filled 2013-03-19: qty 2

## 2013-03-19 MED ORDER — ROPIVACAINE HCL 5 MG/ML IJ SOLN
INTRAMUSCULAR | Status: AC
  Filled 2013-03-19: qty 60

## 2013-03-19 MED ORDER — SCOPOLAMINE 1 MG/3DAYS TD PT72: 1.0000 | MEDICATED_PATCH | TRANSDERMAL | Status: DC

## 2013-03-19 MED ORDER — OXYCODONE-ACETAMINOPHEN 5-325 MG PO TABS: 1.0000 | ORAL_TABLET | ORAL | Status: DC | PRN

## 2013-03-19 MED ORDER — MIDAZOLAM HCL 2 MG/2ML IJ SOLN
INTRAMUSCULAR | Status: AC
  Filled 2013-03-19: qty 2

## 2013-03-19 MED ORDER — SCOPOLAMINE 1 MG/3DAYS TD PT72
MEDICATED_PATCH | TRANSDERMAL | Status: AC
  Administered 2013-03-19: 1.5 mg via TRANSDERMAL
  Filled 2013-03-19: qty 1

## 2013-03-19 MED ORDER — NEOSTIGMINE METHYLSULFATE 1 MG/ML IJ SOLN
INTRAMUSCULAR | Status: AC
  Filled 2013-03-19: qty 1

## 2013-03-19 MED ORDER — ROCURONIUM BROMIDE 100 MG/10ML IV SOLN
INTRAVENOUS | Status: DC | PRN
  Administered 2013-03-19: 5 mg via INTRAVENOUS
  Administered 2013-03-19: 45 mg via INTRAVENOUS
  Administered 2013-03-19: 20 mg via INTRAVENOUS

## 2013-03-19 MED ORDER — ONDANSETRON HCL 4 MG/2ML IJ SOLN
INTRAMUSCULAR | Status: DC | PRN
  Administered 2013-03-19: 4 mg via INTRAVENOUS

## 2013-03-19 MED ORDER — LIDOCAINE HCL (CARDIAC) 20 MG/ML IV SOLN
INTRAVENOUS | Status: AC
  Filled 2013-03-19: qty 5

## 2013-03-19 MED ORDER — EPHEDRINE SULFATE 50 MG/ML IJ SOLN
INTRAMUSCULAR | Status: DC | PRN
  Administered 2013-03-19: 5 mg via INTRAVENOUS

## 2013-03-19 MED ORDER — PROPOFOL 10 MG/ML IV EMUL
INTRAVENOUS | Status: AC
  Filled 2013-03-19: qty 40

## 2013-03-19 MED ORDER — GLYCOPYRROLATE 0.2 MG/ML IJ SOLN
INTRAMUSCULAR | Status: AC
  Filled 2013-03-19: qty 3

## 2013-03-19 MED ORDER — CEFAZOLIN SODIUM-DEXTROSE 2-3 GM-% IV SOLR
INTRAVENOUS | Status: AC
  Administered 2013-03-19: 2 g via INTRAVENOUS
  Filled 2013-03-19: qty 50

## 2013-03-19 MED ORDER — LIDOCAINE HCL (CARDIAC) 20 MG/ML IV SOLN
INTRAVENOUS | Status: DC | PRN
  Administered 2013-03-19: 80 mg via INTRAVENOUS

## 2013-03-19 MED ORDER — MIDAZOLAM HCL 5 MG/5ML IJ SOLN
INTRAMUSCULAR | Status: DC | PRN
  Administered 2013-03-19: 2 mg via INTRAVENOUS

## 2013-03-19 MED ORDER — NEOSTIGMINE METHYLSULFATE 1 MG/ML IJ SOLN
INTRAMUSCULAR | Status: DC | PRN
  Administered 2013-03-19: 1 mg via INTRAVENOUS

## 2013-03-19 MED ORDER — PROPOFOL 10 MG/ML IV BOLUS
INTRAVENOUS | Status: DC | PRN
  Administered 2013-03-19: 200 mg via INTRAVENOUS

## 2013-03-19 MED ORDER — ONDANSETRON HCL 4 MG/2ML IJ SOLN: 4.0000 mg | Freq: Four times a day (QID) | INTRAMUSCULAR | Status: DC | PRN

## 2013-03-19 MED ORDER — LACTATED RINGERS IV SOLN
INTRAVENOUS | Status: DC
  Administered 2013-03-19 (×3): via INTRAVENOUS

## 2013-03-19 MED ORDER — ROPIVACAINE HCL 5 MG/ML IJ SOLN
INTRAMUSCULAR | Status: DC | PRN
  Administered 2013-03-19: 60 mL

## 2013-03-19 MED ORDER — ARTIFICIAL TEARS OP OINT
TOPICAL_OINTMENT | OPHTHALMIC | Status: DC | PRN
  Administered 2013-03-19: 1 via OPHTHALMIC

## 2013-03-19 MED ORDER — GLYCOPYRROLATE 0.2 MG/ML IJ SOLN
INTRAMUSCULAR | Status: DC | PRN
  Administered 2013-03-19: 0.1 mg via INTRAVENOUS
  Administered 2013-03-19: 0.2 mg via INTRAVENOUS

## 2013-03-19 MED ORDER — ONDANSETRON HCL 4 MG PO TABS
4.0000 mg | ORAL_TABLET | Freq: Four times a day (QID) | ORAL | Status: DC | PRN
  Administered 2013-03-19: 4 mg via ORAL
  Filled 2013-03-19: qty 1

## 2013-03-19 MED ORDER — IBUPROFEN 800 MG PO TABS: 800.0000 mg | ORAL_TABLET | Freq: Three times a day (TID) | ORAL | Status: DC | PRN

## 2013-03-19 MED ORDER — DEXAMETHASONE SODIUM PHOSPHATE 10 MG/ML IJ SOLN
INTRAMUSCULAR | Status: AC
  Filled 2013-03-19: qty 1

## 2013-03-19 MED ORDER — FENTANYL CITRATE 0.05 MG/ML IJ SOLN
INTRAMUSCULAR | Status: AC
  Filled 2013-03-19: qty 5

## 2013-03-19 MED ORDER — FENTANYL CITRATE 0.05 MG/ML IJ SOLN
INTRAMUSCULAR | Status: AC
  Filled 2013-03-19: qty 2

## 2013-03-19 MED ORDER — ONDANSETRON HCL 4 MG/2ML IJ SOLN
INTRAMUSCULAR | Status: AC
  Filled 2013-03-19: qty 2

## 2013-03-19 MED ORDER — STERILE WATER FOR IRRIGATION IR SOLN
Status: DC | PRN
  Administered 2013-03-19: 1000 mL

## 2013-03-19 MED ORDER — IBUPROFEN 800 MG PO TABS
800.0000 mg | ORAL_TABLET | Freq: Three times a day (TID) | ORAL | Status: DC | PRN
  Administered 2013-03-19: 800 mg via ORAL
  Filled 2013-03-19: qty 1

## 2013-03-19 SURGICAL SUPPLY — 64 items
ADH SKN CLS APL DERMABOND .7 (GAUZE/BANDAGES/DRESSINGS) ×2
APL SKNCLS STERI-STRIP NONHPOA (GAUZE/BANDAGES/DRESSINGS)
BAG URINE DRAINAGE (UROLOGICAL SUPPLIES) ×3 IMPLANT
BARRIER ADHS 3X4 INTERCEED (GAUZE/BANDAGES/DRESSINGS) IMPLANT
BENZOIN TINCTURE PRP APPL 2/3 (GAUZE/BANDAGES/DRESSINGS) ×2 IMPLANT
BRR ADH 4X3 ABS CNTRL BYND (GAUZE/BANDAGES/DRESSINGS)
CATH FOLEY 3WAY  5CC 16FR (CATHETERS) ×1
CATH FOLEY 3WAY 5CC 16FR (CATHETERS) ×2 IMPLANT
CLOTH BEACON ORANGE TIMEOUT ST (SAFETY) ×3 IMPLANT
CONT PATH 16OZ SNAP LID 3702 (MISCELLANEOUS) ×3 IMPLANT
COVER MAYO STAND STRL (DRAPES) ×3 IMPLANT
COVER TABLE BACK 60X90 (DRAPES) ×6 IMPLANT
COVER TIP SHEARS 8 DVNC (MISCELLANEOUS) ×2 IMPLANT
COVER TIP SHEARS 8MM DA VINCI (MISCELLANEOUS) ×1
DECANTER SPIKE VIAL GLASS SM (MISCELLANEOUS) ×3 IMPLANT
DERMABOND ADVANCED (GAUZE/BANDAGES/DRESSINGS) ×1
DERMABOND ADVANCED .7 DNX12 (GAUZE/BANDAGES/DRESSINGS) ×2 IMPLANT
DEVICE TROCAR PUNCTURE CLOSURE (ENDOMECHANICALS) IMPLANT
DRAPE HUG U DISPOSABLE (DRAPE) ×3 IMPLANT
DRAPE LG THREE QUARTER DISP (DRAPES) ×6 IMPLANT
DRAPE WARM FLUID 44X44 (DRAPE) ×3 IMPLANT
ELECT REM PT RETURN 9FT ADLT (ELECTROSURGICAL) ×3
ELECTRODE REM PT RTRN 9FT ADLT (ELECTROSURGICAL) ×2 IMPLANT
EVACUATOR SMOKE 8.L (FILTER) ×3 IMPLANT
GAUZE VASELINE 3X9 (GAUZE/BANDAGES/DRESSINGS) IMPLANT
GLOVE BIO SURGEON STRL SZ 6.5 (GLOVE) ×6 IMPLANT
GLOVE ECLIPSE 6.5 STRL STRAW (GLOVE) ×9 IMPLANT
GOWN STRL REIN XL XLG (GOWN DISPOSABLE) ×18 IMPLANT
LEGGING LITHOTOMY PAIR STRL (DRAPES) ×3 IMPLANT
MANIPULATOR UTERINE 4.5 ZUMI (MISCELLANEOUS) ×1 IMPLANT
NDL SPNL 18GX3.5 QUINCKE PK (NEEDLE) IMPLANT
NEEDLE INSUFFLATION 120MM (ENDOMECHANICALS) ×3 IMPLANT
NEEDLE SPNL 18GX3.5 QUINCKE PK (NEEDLE) ×3 IMPLANT
OCCLUDER COLPOPNEUMO (BALLOONS) ×3 IMPLANT
PACK LAVH (CUSTOM PROCEDURE TRAY) ×3 IMPLANT
PAD PREP 24X48 CUFFED NSTRL (MISCELLANEOUS) ×6 IMPLANT
PLUG CATH AND CAP STER (CATHETERS) ×3 IMPLANT
PROTECTOR NERVE ULNAR (MISCELLANEOUS) ×6 IMPLANT
SET CYSTO W/LG BORE CLAMP LF (SET/KITS/TRAYS/PACK) ×2 IMPLANT
SET IRRIG TUBING LAPAROSCOPIC (IRRIGATION / IRRIGATOR) ×3 IMPLANT
SOLUTION ELECTROLUBE (MISCELLANEOUS) ×3 IMPLANT
STRIP CLOSURE SKIN 1/2X4 (GAUZE/BANDAGES/DRESSINGS) ×2 IMPLANT
SUT VIC AB 0 CT1 27 (SUTURE) ×6
SUT VIC AB 0 CT1 27XBRD ANBCTR (SUTURE) ×4 IMPLANT
SUT VIC AB 0 CT1 27XBRD ANTBC (SUTURE) IMPLANT
SUT VICRYL 0 UR6 27IN ABS (SUTURE) ×6 IMPLANT
SUT VICRYL RAPIDE 4/0 PS 2 (SUTURE) ×6 IMPLANT
SUT VLOC 180 0 9IN  GS21 (SUTURE) ×1
SUT VLOC 180 0 9IN GS21 (SUTURE) IMPLANT
SYR 50ML LL SCALE MARK (SYRINGE) ×3 IMPLANT
SYSTEM CONVERTIBLE TROCAR (TROCAR) IMPLANT
TIP RUMI ORANGE 6.7MMX12CM (TIP) IMPLANT
TIP UTERINE 5.1X6CM LAV DISP (MISCELLANEOUS) IMPLANT
TIP UTERINE 6.7X10CM GRN DISP (MISCELLANEOUS) ×1 IMPLANT
TIP UTERINE 6.7X6CM WHT DISP (MISCELLANEOUS) IMPLANT
TIP UTERINE 6.7X8CM BLUE DISP (MISCELLANEOUS) IMPLANT
TOWEL OR 17X24 6PK STRL BLUE (TOWEL DISPOSABLE) ×6 IMPLANT
TROCAR BLADELESS OPT 5 100 (ENDOMECHANICALS) ×3 IMPLANT
TROCAR DILATING TIP 12MM 150MM (ENDOMECHANICALS) ×3 IMPLANT
TROCAR DISP BLADELESS 8 DVNC (TROCAR) ×2 IMPLANT
TROCAR DISP BLADELESS 8MM (TROCAR) ×1
TROCAR XCEL 12X100 BLDLESS (ENDOMECHANICALS) ×1 IMPLANT
TUBING FILTER THERMOFLATOR (ELECTROSURGICAL) ×3 IMPLANT
WATER STERILE IRR 1000ML POUR (IV SOLUTION) ×9 IMPLANT

## 2013-03-19 NOTE — Progress Notes (Signed)
Spoke to dr dove and ok to give ancef 2grams.

## 2013-03-19 NOTE — Anesthesia Postprocedure Evaluation (Signed)
  Anesthesia Post-op Note  Anesthesia Post Note  Patient: Desiree Jackson  Procedure(s) Performed: Procedure(s) (LRB): ROBOTIC ASSISTED TOTAL HYSTERECTOMY (N/A) CYSTOSCOPY (N/A)  Anesthesia type: General  Patient location: [facility]  Post pain: Pain level controlled  Post assessment: Post-op Vital signs reviewed  Last Vitals:  Filed Vitals:   03/19/13 1243  BP: 120/55  Pulse: 71  Temp: 36.6 C  Resp: 17    Post vital signs: Reviewed  Level of consciousness: sedated  Complications: No apparent anesthesia complications

## 2013-03-19 NOTE — Op Note (Signed)
03/19/2013  10:04 AM  PATIENT:  Desiree Jackson  32 y.o. female  PRE-OPERATIVE DIAGNOSIS:   Dysmenorrhea & dysfunctional uterine bleeding, pelvic pain, morbid obesity  POST-OPERATIVE DIAGNOSIS:  Same + endometriosis  PROCEDURE:  Procedure(s): ROBOTIC ASSISTED TOTAL HYSTERECTOMY (N/A) CYSTOSCOPY (N/A) BILATERAL SALPINGOOPHERECTOMY EXCISION OF ENDOMETRIOSIS  SURGEON:  Surgeon(s) and Role:    * Allie Bossier, MD - Primary   PHYSICIAN ASSISTANT:   ASSISTANTS: Peggy Constant, MD   ANESTHESIA:   general  EBL:  Total I/O In: 700 [I.V.:700] Out: 400 [Urine:300; Blood:100]  BLOOD ADMINISTERED:none  DRAINS: none   LOCAL MEDICATIONS USED:  OTHER ropivicaine  SPECIMEN:  Source of Specimen:  uterus, tubes, ovaries, cul de sac endometriosis  DISPOSITION OF SPECIMEN:  PATHOLOGY  COUNTS:  YES  TOURNIQUET:  * No tourniquets in log *  DICTATION: .Dragon Dictation  PLAN OF CARE: Outpatient with extended recovery  PATIENT DISPOSITION:  PACU - hemodynamically stable.   Delay start of Pharmacological VTE agent (>24hrs) due to surgical blood loss or risk of bleeding: not applicable  The risks, benefits, and alternatives of surgery were explained, understood, and accepted. Consents were signed. All questions were answered. She was taken to the operating room and general anesthesia was applied without complication. She was placed in the dorsal lithotomy position and her abdomen and vagina were prepped and draped after she had been carefully positioned on the table. A bimanual exam revealed a 8 week size uterus that was mobile. Her adnexa were not enlarged. The cervix was measured and the uterus was sounded to 10 cm. A Rumi uterine manipulator was placed without difficulty. A Foley catheter was placed and it drained clear throughout the case. Gloves were changed and attention was turned to the abdomen. A vertical umbilical incision was made and a Veress needle was placed intraperitoneally.  CO2 was used to insufflate the abdomen to approximately 5 L. After good pneumoperitoneum was established, a 11 mm trocar was placed in the umbilicus. Laparoscopy confirmed correct placement. She was placed in Trendelenburg position and ports were placed in appropriate positions on her abdomen to allow maximum exposure during the robotic case. Specifically there was an 12 mm assistant port placed in the right lower quadrant under direct laproscopic visualization. A 8 mm port was placed about 10 cm lateral to the umbilical port on each side under direct laparoscopic visualization. Prior to all incisions, each site was injected with ropivicaine. The robot was attached to the ports and I proceeded with a robotic portion of the case. The pelvis was inspected and in  an alpha manner. There was endometriosis noted through out the cul de sac. The ureters and the infundibulopelvic ligaments were identified. The infundibulopelvic ligaments on each side was cauterized and cut. The PK/gyrus instrument was used for this portion. The round ligaments were identified, cauterized and ligated. A bladder flap was created anteriorly. The uterine vessels were identified and cauterized and then cut.The bladder was pushed out of the operative site and an anterior colpotomy was made. The colpotomy incision was extended circumferentially, following the blue outline of the Rumi manipulator. All pedicles were hemostatic. The uterus with attatched ovaries and tubes was removed through the vagina. The vaginal cuff was closed with v lock 0 Vicryl suture. No defects were palpable through the vagina. Excellent hemostasis was noted throughout. The pelvis was irrigated. At this point I performed cystoscopy. The cystoscopy revealed blue ejection from both ureters. Through the laparoscope I was able to see the ureters functioning  normally and appearing of normal caliber. I then excised some of the endometriosis from the cul de sac.  At this point we  used the laparoscopic fascial closure device on the 12 mm assistant port site. The CO2 was allowed to escape from the abdomen and the remainder of the ports were removed. The fascia at the umbilicus was elevated and closed in a figure-of-eight fashion with a 0 Vicryl suture. A subcuticular closure was done at all incision sites with 4-0 Vicryl suture. She was extubated and taken to recovery in stable condition.

## 2013-03-19 NOTE — Transfer of Care (Signed)
Immediate Anesthesia Transfer of Care Note  Patient: Desiree Jackson  Procedure(s) Performed: Procedure(s): ROBOTIC ASSISTED TOTAL HYSTERECTOMY (N/A) CYSTOSCOPY (N/A)  Patient Location: PACU  Anesthesia Type:General  Level of Consciousness: awake, alert  and oriented  Airway & Oxygen Therapy: Patient Spontanous Breathing and Patient connected to nasal cannula oxygen  Post-op Assessment: Report given to PACU RN and Post -op Vital signs reviewed and stable  Post vital signs: Reviewed and stable  Complications: No apparent anesthesia complications

## 2013-03-19 NOTE — Anesthesia Procedure Notes (Signed)
Procedure Name: Intubation Date/Time: 03/19/2013 7:35 AM Performed by: Graciela Husbands Pre-anesthesia Checklist: Patient identified, Patient being monitored, Timeout performed, Emergency Drugs available and Suction available Patient Re-evaluated:Patient Re-evaluated prior to inductionOxygen Delivery Method: Circle system utilized Preoxygenation: Pre-oxygenation with 100% oxygen Intubation Type: IV induction Ventilation: Oral airway inserted - appropriate to patient size and Mask ventilation without difficulty Laryngoscope Size: Mac and 3 Grade View: Grade I Tube size: 7.0 mm Number of attempts: 1 Airway Equipment and Method: Patient positioned with wedge pillow and Stylet Placement Confirmation: ETT inserted through vocal cords under direct vision,  breath sounds checked- equal and bilateral and positive ETCO2 Secured at: 21 cm Tube secured with: Tape Dental Injury: Teeth and Oropharynx as per pre-operative assessment  Difficulty Due To: Difficulty was anticipated
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
## 2013-03-19 NOTE — Anesthesia Postprocedure Evaluation (Signed)
Anesthesia Post Note  Patient: Desiree Jackson  Procedure(s) Performed: Procedure(s) (LRB): ROBOTIC ASSISTED TOTAL HYSTERECTOMY (N/A) CYSTOSCOPY (N/A)  Anesthesia type: General  Patient location: PACU  Post pain: Pain level controlled  Post assessment: Post-op Vital signs reviewed  Last Vitals:  Filed Vitals:   03/19/13 1000  BP: 101/54  Pulse: 57  Temp: 37 C  Resp: 14    Post vital signs: Reviewed  Level of consciousness: sedated  Complications: No apparent anesthesia complications

## 2013-03-19 NOTE — Anesthesia Preprocedure Evaluation (Addendum)
Anesthesia Evaluation  Patient identified by MRN, date of birth, ID band Patient awake    Reviewed: Allergy & Precautions, H&P , NPO status , Patient's Chart, lab work & pertinent test results, reviewed documented beta blocker date and time   History of Anesthesia Complications (+) PONV  Airway Mallampati: III TM Distance: >3 FB Neck ROM: full    Dental  (+) Edentulous Upper and Edentulous Lower   Pulmonary  Seasonal allergies breath sounds clear to auscultation  Pulmonary exam normal       Cardiovascular Exercise Tolerance: Good negative cardio ROS  Rhythm:regular Rate:Normal     Neuro/Psych negative neurological ROS  negative psych ROS   GI/Hepatic Neg liver ROS, GERD-  Medicated,  Endo/Other  Hypothyroidism Morbid obesity  Renal/GU negative Renal ROS  Female GU complaint     Musculoskeletal   Abdominal   Peds  Hematology negative hematology ROS (+)   Anesthesia Other Findings Anaphylaxis to PCN  Reproductive/Obstetrics (+) Breast feeding                           Anesthesia Physical Anesthesia Plan  ASA: III  Anesthesia Plan: General ETT   Post-op Pain Management:    Induction:   Airway Management Planned:   Additional Equipment:   Intra-op Plan:   Post-operative Plan:   Informed Consent: I have reviewed the patients History and Physical, chart, labs and discussed the procedure including the risks, benefits and alternatives for the proposed anesthesia with the patient or authorized representative who has indicated his/her understanding and acceptance.   Dental Advisory Given  Plan Discussed with: CRNA and Surgeon  Anesthesia Plan Comments:         Anesthesia Quick Evaluation

## 2013-03-20 ENCOUNTER — Encounter (HOSPITAL_COMMUNITY): Payer: Self-pay | Admitting: Obstetrics & Gynecology

## 2013-03-24 ENCOUNTER — Ambulatory Visit: Payer: BC Managed Care – PPO | Admitting: Obstetrics & Gynecology

## 2013-05-04 ENCOUNTER — Ambulatory Visit: Payer: BC Managed Care – PPO | Admitting: Family Medicine

## 2013-05-07 ENCOUNTER — Ambulatory Visit (INDEPENDENT_AMBULATORY_CARE_PROVIDER_SITE_OTHER): Payer: BC Managed Care – PPO | Admitting: Obstetrics & Gynecology

## 2013-05-07 ENCOUNTER — Encounter: Payer: Self-pay | Admitting: Obstetrics & Gynecology

## 2013-05-07 VITALS — BP 147/82 | HR 80 | Resp 16 | Ht 62.0 in | Wt 241.0 lb

## 2013-05-07 DIAGNOSIS — E039 Hypothyroidism, unspecified: Secondary | ICD-10-CM

## 2013-05-07 DIAGNOSIS — Z09 Encounter for follow-up examination after completed treatment for conditions other than malignant neoplasm: Secondary | ICD-10-CM

## 2013-05-07 MED ORDER — ESTRADIOL 0.5 MG PO TABS: 0.5000 mg | ORAL_TABLET | Freq: Every day | ORAL | Status: DC
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
## 2013-05-08 LAB — TSH: TSH: 0.14 u[IU]/mL — ABNORMAL LOW (ref 0.350–4.500)

## 2013-05-11 ENCOUNTER — Telehealth: Payer: Self-pay | Admitting: *Deleted

## 2013-05-11 ENCOUNTER — Telehealth: Payer: Self-pay | Admitting: Family Medicine

## 2013-05-11 DIAGNOSIS — E039 Hypothyroidism, unspecified: Secondary | ICD-10-CM

## 2013-05-11 MED ORDER — LEVOTHYROXINE SODIUM 112 MCG PO TABS: 112.0000 ug | ORAL_TABLET | Freq: Every day | ORAL | Status: DC

## 2013-05-11 NOTE — Telephone Encounter (Signed)
Recent GYN MD check of her TSH showed that she needs to decrease her dose of synthroid a little. I will send in new rx for 112 mcg tabs and she'll take one daily.  Recheck TSH needed in 6-8 wks (lab visit)-thx

## 2013-05-11 NOTE — Telephone Encounter (Signed)
Pt notified of abnormal TSH and a copy will be faxed to her endocrinologist.

## 2013-05-11 NOTE — Telephone Encounter (Signed)
Message copied by Granville Lewis on Mon May 11, 2013 11:34 AM ------      Message from: Allie Bossier      Created: Fri May 08, 2013 11:09 AM       Please give her TSH results and have her endocrinologist continue to manage her hypothyroidism.       Thanks       ------

## 2013-05-12 NOTE — Telephone Encounter (Signed)
Patient aware of new medication strength.  Lab visit scheduled 06/28/13 at 10am to recheck TSH.

## 2013-06-09 ENCOUNTER — Ambulatory Visit (INDEPENDENT_AMBULATORY_CARE_PROVIDER_SITE_OTHER): Payer: BC Managed Care – PPO | Admitting: Family Medicine

## 2013-06-09 ENCOUNTER — Encounter: Payer: Self-pay | Admitting: Family Medicine

## 2013-06-09 VITALS — BP 132/88 | HR 67 | Temp 97.8°F | Resp 18 | Ht 62.0 in | Wt 241.0 lb

## 2013-06-09 DIAGNOSIS — R51 Headache: Secondary | ICD-10-CM

## 2013-06-09 DIAGNOSIS — M542 Cervicalgia: Secondary | ICD-10-CM | POA: Insufficient documentation

## 2013-06-09 DIAGNOSIS — R519 Headache, unspecified: Secondary | ICD-10-CM | POA: Insufficient documentation

## 2013-06-09 DIAGNOSIS — Z23 Encounter for immunization: Secondary | ICD-10-CM

## 2013-06-09 MED ORDER — CYCLOBENZAPRINE HCL 10 MG PO TABS: 10.0000 mg | ORAL_TABLET | Freq: Three times a day (TID) | ORAL | Status: DC | PRN

## 2013-06-09 NOTE — Patient Instructions (Signed)
Use ice or heat as needed to neck or scalp.  Do gentle range of motion exercises of your neck every day.
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
## 2013-06-15 ENCOUNTER — Telehealth: Payer: Self-pay | Admitting: Family Medicine

## 2013-06-15 DIAGNOSIS — G4486 Cervicogenic headache: Secondary | ICD-10-CM

## 2013-06-15 NOTE — Telephone Encounter (Signed)
Patient requesting neurology referral.  Patient's headache are no better.  Please advise.

## 2013-06-15 NOTE — Telephone Encounter (Signed)
Order placed for referral to [facility] neurology.

## 2013-06-30 ENCOUNTER — Other Ambulatory Visit: Payer: Self-pay | Admitting: Family Medicine

## 2013-06-30 ENCOUNTER — Ambulatory Visit: Payer: BC Managed Care – PPO | Admitting: Neurology

## 2013-06-30 ENCOUNTER — Other Ambulatory Visit: Payer: BC Managed Care – PPO | Admitting: Family Medicine

## 2013-06-30 ENCOUNTER — Encounter: Payer: Self-pay | Admitting: Neurology

## 2013-06-30 DIAGNOSIS — E039 Hypothyroidism, unspecified: Secondary | ICD-10-CM

## 2013-07-09 ENCOUNTER — Other Ambulatory Visit: Payer: Self-pay | Admitting: Family Medicine

## 2013-07-10 ENCOUNTER — Telehealth: Payer: Self-pay | Admitting: Family Medicine

## 2013-07-10 MED ORDER — LEVOTHYROXINE SODIUM 112 MCG PO TABS: 112.0000 ug | ORAL_TABLET | Freq: Every day | ORAL | Status: DC

## 2013-07-10 NOTE — Telephone Encounter (Signed)
Patients husband is calling because patient is out of synthroid and needs a refill, we should have received the results of her TSH from yesterday, she uses the [facility] in [place]

## 2013-08-16 ENCOUNTER — Other Ambulatory Visit (HOSPITAL_COMMUNITY): Payer: Self-pay | Admitting: Obstetrics & Gynecology

## 2013-09-08 ENCOUNTER — Other Ambulatory Visit: Payer: Self-pay | Admitting: Family Medicine

## 2013-09-08 ENCOUNTER — Encounter: Payer: Self-pay | Admitting: Family Medicine

## 2013-09-08 MED ORDER — ESOMEPRAZOLE MAGNESIUM 40 MG PO CPDR: 40.0000 mg | DELAYED_RELEASE_CAPSULE | Freq: Every day | ORAL | Status: DC

## 2013-09-08 NOTE — Telephone Encounter (Signed)
Patient requested nexium refill via mychart.  [facility] per [facility] protocol.

## 2013-11-02 ENCOUNTER — Encounter: Payer: Self-pay | Admitting: Family Medicine

## 2013-11-03 ENCOUNTER — Other Ambulatory Visit: Payer: Self-pay | Admitting: *Deleted

## 2013-11-03 ENCOUNTER — Other Ambulatory Visit: Payer: Self-pay | Admitting: Family Medicine

## 2013-11-03 DIAGNOSIS — L900 Lichen sclerosus et atrophicus: Secondary | ICD-10-CM

## 2013-11-03 MED ORDER — CLOBETASOL PROPIONATE 0.05 % EX CREA: 1.0000 "application " | TOPICAL_CREAM | CUTANEOUS | Status: DC

## 2013-11-03 NOTE — Telephone Encounter (Signed)
Patient was questioning if she could get a refill or if she needed OV.  She is over due for OV.  Patient will need to come in.

## 2013-11-06 ENCOUNTER — Ambulatory Visit: Payer: BC Managed Care – PPO | Admitting: Family Medicine

## 2013-11-10 ENCOUNTER — Ambulatory Visit (INDEPENDENT_AMBULATORY_CARE_PROVIDER_SITE_OTHER): Payer: BC Managed Care – PPO | Admitting: Family Medicine

## 2013-11-10 ENCOUNTER — Encounter: Payer: Self-pay | Admitting: Family Medicine

## 2013-11-10 VITALS — BP 129/85 | HR 70 | Temp 98.0°F | Resp 18 | Ht 62.0 in | Wt 252.0 lb

## 2013-11-10 DIAGNOSIS — E039 Hypothyroidism, unspecified: Secondary | ICD-10-CM

## 2013-11-10 LAB — TSH: TSH: 1.3 u[IU]/mL (ref 0.35–5.50)

## 2013-11-10 LAB — LIPID PANEL
CHOLESTEROL: 185 mg/dL (ref 0–200)
HDL: 36.8 mg/dL — AB (ref >39.00)
LDL Cholesterol: 108 mg/dL — ABNORMAL HIGH (ref 0–99)
Total CHOL/HDL Ratio: 5
Triglycerides: 203 mg/dL — ABNORMAL HIGH (ref 0.0–149.0)
VLDL: 40.6 mg/dL — ABNORMAL HIGH (ref 0.0–40.0)

## 2013-11-10 LAB — COMPREHENSIVE METABOLIC PANEL
ALBUMIN: 4.1 g/dL (ref 3.5–5.2)
ALT: 28 U/L (ref 0–35)
AST: 19 U/L (ref 0–37)
Alkaline Phosphatase: 63 U/L (ref 39–117)
BILIRUBIN TOTAL: 0.7 mg/dL (ref 0.3–1.2)
BUN: 12 mg/dL (ref 6–23)
CO2: 31 meq/L (ref 19–32)
Calcium: 9.6 mg/dL (ref 8.4–10.5)
Chloride: 102 mEq/L (ref 96–112)
Creatinine, Ser: 0.8 mg/dL (ref 0.4–1.2)
GFR: 87.05 mL/min (ref >60.00)
GLUCOSE: 95 mg/dL (ref 70–99)
POTASSIUM: 4.1 meq/L (ref 3.5–5.1)
SODIUM: 139 meq/L (ref 135–145)
TOTAL PROTEIN: 7.3 g/dL (ref 6.0–8.3)

## 2013-11-10 MED ORDER — FEXOFENADINE HCL 180 MG PO TABS: 180.0000 mg | ORAL_TABLET | Freq: Every day | ORAL | Status: DC

## 2013-11-10 MED ORDER — FLUTICASONE PROPIONATE 50 MCG/ACT NA SUSP: 2.0000 | Freq: Every day | NASAL | Status: DC
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
## 2013-11-10 NOTE — Progress Notes (Signed)
Pre visit review using our clinic review tool, if applicable. No additional management support is needed unless otherwise documented below in the visit note. 

## 2013-11-10 NOTE — Addendum Note (Signed)
Addended by: Ralph Dowdy on: 11/10/2013 11:28 AM   Modules accepted: Orders

## 2013-11-11 MED ORDER — LEVOTHYROXINE SODIUM 112 MCG PO TABS: 112.0000 ug | ORAL_TABLET | Freq: Every day | ORAL | Status: DC

## 2013-11-11 NOTE — Addendum Note (Signed)
Addended by: Tammi Sou on: 11/11/2013 08:25 AM   Modules accepted: Orders

## 2013-11-12 ENCOUNTER — Encounter: Payer: Self-pay | Admitting: Family Medicine

## 2014-01-11 ENCOUNTER — Encounter: Payer: Self-pay | Admitting: Family Medicine

## 2014-01-11 ENCOUNTER — Ambulatory Visit (INDEPENDENT_AMBULATORY_CARE_PROVIDER_SITE_OTHER): Payer: BC Managed Care – PPO | Admitting: Family Medicine

## 2014-01-11 VITALS — BP 101/69 | HR 65 | Temp 97.6°F | Resp 18 | Ht 62.0 in | Wt 252.0 lb

## 2014-01-11 DIAGNOSIS — R252 Cramp and spasm: Secondary | ICD-10-CM

## 2014-01-11 MED ORDER — HYDROCODONE-ACETAMINOPHEN 5-325 MG PO TABS: 1.0000 | ORAL_TABLET | Freq: Four times a day (QID) | ORAL | Status: DC | PRN

## 2014-01-11 NOTE — Progress Notes (Signed)
Pre visit review using our clinic review tool, if applicable. No additional management support is needed unless otherwise documented below in the visit note. 

## 2014-01-11 NOTE — Progress Notes (Signed)
OFFICE NOTE  01/11/2014  CC:  Chief Complaint  Patient presents with  . Flank Pain    left sided x Saturday   HPI: Patient is a 33 y.o. Caucasian female who is here for pain.   Two nights ago her approx 150-200 lb friend playfully jumped off couch and landed on pt's left side of chest in axillary area.  Knocked the breath out of her.  Has hurt since, intermittently--sharp pains brought on by moving torso.  No bruise.  No SOB or cough. Denies seeing blood in urine.  Also a 6 wk hx of severe calf "charlie horses", bilateral, nocturnal, usually last up to an hour, happens about 3 times per week.  No clear trigger.  Tries to stay hydrated.    Pertinent PMH:  Past medical, surgical, social, and family history reviewed and no changes are noted since last office visit.  MEDS:  Outpatient Prescriptions Prior to Visit  Medication Sig Dispense Refill  . clobetasol cream (TEMOVATE) 2.95 % Apply 1 application topically every other day.  30 g  0  . esomeprazole (NEXIUM) 40 MG capsule Take 1 capsule (40 mg total) by mouth daily before breakfast.  90 capsule  01  . fexofenadine (ALLEGRA) 180 MG tablet Take 1 tablet (180 mg total) by mouth daily.  30 tablet  6  . fluticasone (FLONASE) 50 MCG/ACT nasal spray Place 2 sprays into both nostrils daily.  16 g  6  . ibuprofen (ADVIL,MOTRIN) 800 MG tablet TAKE ONE TABLET BY MOUTH EVERY EIGHT HOURS AS NEEDED FOR PAIN   60 tablet  1  . levothyroxine (SYNTHROID, LEVOTHROID) 112 MCG tablet Take 1 tablet (112 mcg total) by mouth daily.  90 tablet  1  . cyclobenzaprine (FLEXERIL) 10 MG tablet Take 1 tablet (10 mg total) by mouth 3 (three) times daily as needed for muscle spasms.  30 tablet  1   No facility-administered medications prior to visit.    PE: Blood pressure 101/69, pulse 65, temperature 97.6 F (36.4 C), temperature source Temporal, resp. rate 18, height 5\' 2"  (1.575 m), weight 252 lb (114.306 kg), last menstrual period 03/06/2013, SpO2 97.00%. Pt  examined with CMA Jacklynn Ganong as chaperone. Gen: Alert, well appearing.  Patient is oriented to person, place, time, and situation. Left chest wall, lateral ribs, and some of posterior chest wall on left with diffuse mild TTP.   No crepitus, no bruising, no step offs.   LE strength 5/5 prox and dist bilat, DTRs symmetric, no calf tenderness or swelling or erythema.   IMPRESSION AND PLAN:  1) Left sided chest wall contusion: discussed rest, ice, ibuprofen 800mg  tid with food. Vicodin 5/325, 1-2 q6h prn severe pain, #30, no RF.  Therapeutic expectations and side effect profile of medication discussed today.  Patient's questions answered.  2) Calf cramps: reassured pt.   Likely idiopathic.  Will check lytes, mg, po4.  3) Chronic cervicogenic HA's: I referred her to neurology b/c of the persistence of these.  Her neurologist did a lab panel (which I reviewed with pt today) that showed ANA + at 1:40 with a homogenous pattern, plus Anti DS DNA of 5 (indeterminate).  This triggered a rheumatology referral, which the patient says was basically the rheumatologist telling there is nothing wrong.  I reassured her today that the lab abnormality was likely not clinically meaningful. The labs also showed a vit B12 level that was 384 (lower end of normal) but the notes on the paper copy of labs from  neuro show that she was to be started on Vit B12 IM 1000 mcg qWeek x 4, then 1000 mcg IM q month.  Pt has poor/no recollection of this.  She does not know of any planned neuro or rheum f/u. I told her I would try to get records.  An After Visit Summary was printed and given to the patient.  FOLLOW UP: prn

## 2014-01-12 LAB — BASIC METABOLIC PANEL
BUN: 13 mg/dL (ref 6–23)
CALCIUM: 9.4 mg/dL (ref 8.4–10.5)
CO2: 29 mEq/L (ref 19–32)
Chloride: 105 mEq/L (ref 96–112)
Creatinine, Ser: 1.1 mg/dL (ref 0.4–1.2)
GFR: 62.84 mL/min (ref >60.00)
GLUCOSE: 88 mg/dL (ref 70–99)
Potassium: 4 mEq/L (ref 3.5–5.1)
SODIUM: 140 meq/L (ref 135–145)

## 2014-01-12 LAB — PHOSPHORUS: Phosphorus: 4.1 mg/dL (ref 2.3–4.6)

## 2014-04-12 ENCOUNTER — Other Ambulatory Visit: Payer: Self-pay | Admitting: Family Medicine

## 2014-04-12 ENCOUNTER — Other Ambulatory Visit: Payer: Self-pay | Admitting: Obstetrics & Gynecology

## 2014-04-20 ENCOUNTER — Telehealth: Payer: Self-pay | Admitting: *Deleted

## 2014-04-20 DIAGNOSIS — L900 Lichen sclerosus et atrophicus: Secondary | ICD-10-CM

## 2014-04-20 MED ORDER — CLOBETASOL PROPIONATE 0.05 % EX CREA: 1.0000 "application " | TOPICAL_CREAM | CUTANEOUS | Status: DC

## 2014-04-20 NOTE — Telephone Encounter (Signed)
RF authorization sent to [facility] for Clobetasol cream.

## 2014-05-05 ENCOUNTER — Other Ambulatory Visit: Payer: Self-pay | Admitting: Family Medicine

## 2014-05-05 MED ORDER — LEVOTHYROXINE SODIUM 112 MCG PO TABS: 112.0000 ug | ORAL_TABLET | Freq: Every day | ORAL | Status: DC

## 2014-06-02 ENCOUNTER — Encounter: Payer: Self-pay | Admitting: Family Medicine

## 2014-06-02 ENCOUNTER — Ambulatory Visit (INDEPENDENT_AMBULATORY_CARE_PROVIDER_SITE_OTHER): Payer: BLUE CROSS/BLUE SHIELD | Admitting: Family Medicine

## 2014-06-02 VITALS — BP 124/75 | HR 61 | Temp 97.7°F | Ht 62.0 in | Wt 246.0 lb

## 2014-06-02 DIAGNOSIS — R05 Cough: Secondary | ICD-10-CM | POA: Diagnosis not present

## 2014-06-02 DIAGNOSIS — R059 Cough, unspecified: Secondary | ICD-10-CM

## 2014-06-02 MED ORDER — ALBUTEROL SULFATE (2.5 MG/3ML) 0.083% IN NEBU
2.5000 mg | INHALATION_SOLUTION | Freq: Once | RESPIRATORY_TRACT | Status: AC
  Administered 2014-06-02: 2.5 mg via RESPIRATORY_TRACT

## 2014-06-02 MED ORDER — HYDROCODONE-HOMATROPINE 5-1.5 MG/5ML PO SYRP: 5.0000 mL | ORAL_SOLUTION | Freq: Every evening | ORAL | Status: DC | PRN

## 2014-06-02 MED ORDER — ALBUTEROL SULFATE 108 (90 BASE) MCG/ACT IN AEPB: 2.0000 | INHALATION_SPRAY | Freq: Four times a day (QID) | RESPIRATORY_TRACT | Status: DC | PRN

## 2014-06-02 MED ORDER — AZITHROMYCIN 250 MG PO TABS: ORAL_TABLET | ORAL | Status: DC

## 2014-06-02 MED ORDER — PREDNISONE 20 MG PO TABS: 40.0000 mg | ORAL_TABLET | Freq: Every day | ORAL | Status: DC

## 2014-06-02 NOTE — Progress Notes (Signed)
   Subjective:    Patient ID: Desiree Jackson, female    DOB: [birth year], 33 y.o.   MRN: [id number]  URI   Sinus congestion x 1-2 wks using mucinex -d and tylenol not helping yellow mucus..  Feels lightheaded.  Chest feels tight and hurts.  No facial pain.  Hearing crackle sin her breathing when she lays flat.  No ST or ear pain. + runny nose.    Review of Systems     Objective:   Physical Exam  Constitutional: She is oriented to person, place, and time. She appears well-developed and well-nourished.  HENT:  Head: Normocephalic and atraumatic.  Right Ear: External ear normal.  Left Ear: External ear normal.  Nose: Nose normal.  Mouth/Throat: Oropharynx is clear and moist.  TMs and canals are clear.   Eyes: Conjunctivae and EOM are normal. Pupils are equal, round, and reactive to light.  Neck: Neck supple. No thyromegaly present.  Cardiovascular: Normal rate, regular rhythm and normal heart sounds.   Pulmonary/Chest: Effort normal and breath sounds normal. She has no wheezes.  Diffuse rhonchi  Lymphadenopathy:    She has no cervical adenopathy.  Neurological: She is alert and oriented to person, place, and time.  Skin: Skin is warm and dry.  Psychiatric: She has a normal mood and affect.          Assessment & Plan:  Bronchitis/sinusitis - Will tx with azithro since PCN allergy.  Given Proair for wheezing at night.  Given Rx cough syrup.  F/U if not better in one week or if change in sxs. Peak flow in the yellow zone. Neb given here in office today. Repeat peak flow in the yellow. Given prednisone as well.

## 2014-06-11 ENCOUNTER — Other Ambulatory Visit: Payer: Self-pay

## 2014-06-28 ENCOUNTER — Encounter: Payer: Self-pay | Admitting: Family Medicine

## 2014-08-16 ENCOUNTER — Other Ambulatory Visit: Payer: Self-pay | Admitting: *Deleted

## 2014-08-16 ENCOUNTER — Encounter: Payer: Self-pay | Admitting: Family Medicine

## 2014-08-16 ENCOUNTER — Ambulatory Visit (INDEPENDENT_AMBULATORY_CARE_PROVIDER_SITE_OTHER): Payer: BLUE CROSS/BLUE SHIELD | Admitting: Family Medicine

## 2014-08-16 VITALS — BP 122/84 | HR 83 | Temp 98.3°F | Wt 250.0 lb

## 2014-08-16 DIAGNOSIS — J309 Allergic rhinitis, unspecified: Secondary | ICD-10-CM | POA: Insufficient documentation

## 2014-08-16 DIAGNOSIS — J302 Other seasonal allergic rhinitis: Secondary | ICD-10-CM

## 2014-08-16 DIAGNOSIS — E069 Thyroiditis, unspecified: Secondary | ICD-10-CM

## 2014-08-16 DIAGNOSIS — E041 Nontoxic single thyroid nodule: Secondary | ICD-10-CM | POA: Diagnosis not present

## 2014-08-16 DIAGNOSIS — K2100 Gastro-esophageal reflux disease with esophagitis, without bleeding: Secondary | ICD-10-CM

## 2014-08-16 MED ORDER — PANTOPRAZOLE SODIUM 40 MG PO TBEC: 40.0000 mg | DELAYED_RELEASE_TABLET | Freq: Every day | ORAL | Status: DC

## 2014-08-16 MED ORDER — PANTOPRAZOLE SODIUM 40 MG IV SOLR: 40.0000 mg | Freq: Once | INTRAVENOUS | Status: DC

## 2014-08-16 NOTE — Progress Notes (Signed)
   Subjective:    Patient ID: Desiree Jackson, female    DOB: 06/18/1981, 33 y.o.   MRN: [id number]  HPI Here to follow-up thyroid-no recent skin or hair changes. No changes in energy level. No significant weight changes. She's taking her medication daily about 30 minutes before breakfast on an empty stomach with no other medications. For almost 2 months says her thyoid feels sore and swollen.  Painufl to swallow at times.  No fever or trauma.   GERD- Says when does take her nexium she gets nauseated, sweats, stomach pain and blood in the stool.  She is fighting with her insurance to get this medication covered at a reasonible price.   Review of Systems     Objective:   Physical Exam  Constitutional: She is oriented to person, place, and time. She appears well-developed and well-nourished.  HENT:  Head: Normocephalic and atraumatic.  Neck: Neck supple. Thyromegaly present.  I do palpate enlargement/nodule on the right side of the thyroid gland at the lower pole. It is definitely asymmetric.  Lymphadenopathy:    She has no cervical adenopathy.  Neurological: She is alert and oriented to person, place, and time.  Skin: Skin is warm and dry.  Psychiatric: She has a normal mood and affect. Her behavior is normal.          Assessment & Plan:  GERD -  Her sentencing quite severe. She says in fact she's been encouraged to see GI multiple times in the past but has 2 children with special needs that require a lot of attention. We will try writing for protonic since if it's at least covered and so the Nexium.  Hypothyroid -  We'll schedule her for ultrasound since I do palpate a nodule or lobe of the thyroid gland. We'll also recheck TSH as well as thyroid antibodies today. We'll call with results once available. In the meantime can certainly just use products like Tylenol etc. For pain relief.  Flu vaccine given today.

## 2014-08-17 LAB — TSH: TSH: 1.295 u[IU]/mL (ref 0.350–4.500)

## 2014-08-17 LAB — THYROID PEROXIDASE ANTIBODY: Thyroperoxidase Ab SerPl-aCnc: 58 IU/mL — ABNORMAL HIGH (ref <9)

## 2014-08-18 ENCOUNTER — Other Ambulatory Visit: Payer: BC Managed Care – PPO

## 2014-08-18 ENCOUNTER — Ambulatory Visit (INDEPENDENT_AMBULATORY_CARE_PROVIDER_SITE_OTHER): Payer: BC Managed Care – PPO

## 2014-08-18 DIAGNOSIS — E042 Nontoxic multinodular goiter: Secondary | ICD-10-CM

## 2014-08-18 DIAGNOSIS — E041 Nontoxic single thyroid nodule: Secondary | ICD-10-CM

## 2014-08-23 ENCOUNTER — Telehealth: Payer: Self-pay | Admitting: *Deleted

## 2014-08-23 NOTE — Telephone Encounter (Signed)
lvm asking about the results of the [place] of her thyroid.Desiree Jackson

## 2014-08-23 NOTE — Telephone Encounter (Signed)
I will result and send to your inbasket

## 2014-08-24 ENCOUNTER — Encounter: Payer: Self-pay | Admitting: Family Medicine

## 2014-08-24 ENCOUNTER — Other Ambulatory Visit: Payer: Self-pay | Admitting: Family Medicine

## 2014-08-24 DIAGNOSIS — E041 Nontoxic single thyroid nodule: Secondary | ICD-10-CM

## 2014-08-24 MED ORDER — TRAMADOL HCL 50 MG PO TABS
50.0000 mg | ORAL_TABLET | Freq: Three times a day (TID) | ORAL | Status: DC | PRN
Start: 2014-08-24 — End: 2015-02-15

## 2014-08-24 NOTE — Telephone Encounter (Signed)
See result note.  

## 2014-09-03 ENCOUNTER — Encounter: Payer: Self-pay | Admitting: Family Medicine

## 2014-09-06 ENCOUNTER — Encounter: Payer: Self-pay | Admitting: Emergency Medicine

## 2014-09-06 ENCOUNTER — Emergency Department
Admission: EM | Admit: 2014-09-06 | Discharge: 2014-09-06 | Disposition: A | Discharge status: Home / Self Care | Payer: BLUE CROSS/BLUE SHIELD | Source: Home / Self Care | Attending: Family Medicine | Admitting: Family Medicine

## 2014-09-06 DIAGNOSIS — H8110 Benign paroxysmal vertigo, unspecified ear: Secondary | ICD-10-CM

## 2014-09-06 MED ORDER — MECLIZINE HCL 25 MG PO TABS: ORAL_TABLET | ORAL | Status: DC

## 2014-09-06 NOTE — ED Notes (Signed)
Dizziness started today at noon

## 2014-09-06 NOTE — Discharge Instructions (Signed)
Benign Positional Vertigo Vertigo means you feel like you or your surroundings are moving when they are not. Benign positional vertigo is the most common form of vertigo. Benign means that the cause of your condition is not serious. Benign positional vertigo is more common in older adults. CAUSES  Benign positional vertigo is the result of an upset in the labyrinth system. This is an area in the middle ear that helps control your balance. This may be caused by a viral infection, head injury, or repetitive motion. However, often no specific cause is found. SYMPTOMS  Symptoms of benign positional vertigo occur when you move your head or eyes in different directions. Some of the symptoms may include:  Loss of balance and falls.  Vomiting.  Blurred vision.  Dizziness.  Nausea.  Involuntary eye movements (nystagmus). DIAGNOSIS  Benign positional vertigo is usually diagnosed by physical exam. If the specific cause of your benign positional vertigo is unknown, your caregiver may perform imaging tests, such as magnetic resonance imaging (MRI) or computed tomography (CT). TREATMENT  Your caregiver may recommend movements or procedures to correct the benign positional vertigo. Medicines such as meclizine, benzodiazepines, and medicines for nausea may be used to treat your symptoms. In rare cases, if your symptoms are caused by certain conditions that affect the inner ear, you may need surgery. HOME CARE INSTRUCTIONS   Follow your caregiver's instructions.  Move slowly. Do not make sudden body or head movements.  Avoid driving.  Avoid operating heavy machinery.  Avoid performing any tasks that would be dangerous to you or others during a vertigo episode.  Drink enough fluids to keep your urine clear or pale yellow. SEEK IMMEDIATE MEDICAL CARE IF:   You develop problems with walking, weakness, numbness, or using your arms, hands, or legs.  You have difficulty speaking.  You develop  severe headaches.  Your nausea or vomiting continues or gets worse.  You develop visual changes.  Your family or friends notice any behavioral changes.  Your condition gets worse.  You have a fever.  You develop a stiff neck or sensitivity to light. MAKE SURE YOU:   Understand these instructions.  Will watch your condition.  Will get help right away if you are not doing well or get worse. Document Released: 05/21/2006 Document Revised: 11/05/2011 Document Reviewed: 05/03/2011 ExitCare Patient Information 2015 [employer]. This information is not intended to replace advice given to you by your health care provider. Make sure you discuss any questions you have with your health care provider.    
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
## 2014-09-09 ENCOUNTER — Other Ambulatory Visit: Payer: Self-pay | Admitting: Family Medicine

## 2014-09-09 DIAGNOSIS — E041 Nontoxic single thyroid nodule: Secondary | ICD-10-CM

## 2014-09-10 ENCOUNTER — Other Ambulatory Visit: Payer: Self-pay | Admitting: Family Medicine

## 2014-09-10 ENCOUNTER — Telehealth: Payer: Self-pay

## 2014-09-10 MED ORDER — LEVOTHYROXINE SODIUM 112 MCG PO TABS: 112.0000 ug | ORAL_TABLET | Freq: Every day | ORAL | Status: DC

## 2014-09-10 NOTE — Telephone Encounter (Signed)
Patient states her appointment with Endocrinology is not until February. She wanted to know if Dr Madilyn Fireman thinks it is ok to wait that long. Please advise.

## 2014-09-13 NOTE — Telephone Encounter (Signed)
Continue call and see if we might be able to get her in a little sooner. It's not an emergency but I know she's very anxious and uncomfortable.

## 2014-09-14 ENCOUNTER — Other Ambulatory Visit: Payer: Self-pay | Admitting: Family Medicine

## 2014-09-14 ENCOUNTER — Encounter: Payer: Self-pay | Admitting: Family Medicine

## 2014-09-15 MED ORDER — PREDNISONE 20 MG PO TABS: 40.0000 mg | ORAL_TABLET | Freq: Every day | ORAL | Status: DC

## 2014-09-15 MED ORDER — ESOMEPRAZOLE MAGNESIUM 40 MG PO CPDR: DELAYED_RELEASE_CAPSULE | ORAL | Status: DC

## 2014-09-15 NOTE — Telephone Encounter (Signed)
Called and advised patient of Dr Gardiner Ramus recommendations of prednisone for her thyroid pain. She agreed to try the prednisone and wait for the February appointment.

## 2014-09-15 NOTE — Telephone Encounter (Signed)
rx sent to [facility]

## 2014-10-07 ENCOUNTER — Encounter: Payer: Self-pay | Admitting: Family Medicine

## 2014-10-08 ENCOUNTER — Other Ambulatory Visit: Payer: Self-pay

## 2014-10-08 MED ORDER — ESOMEPRAZOLE MAGNESIUM 40 MG PO CPDR: DELAYED_RELEASE_CAPSULE | ORAL | Status: DC

## 2014-10-08 MED ORDER — ESOMEPRAZOLE MAGNESIUM 40 MG PO CPDR: 40.0000 mg | DELAYED_RELEASE_CAPSULE | Freq: Every day | ORAL | Status: DC

## 2014-10-27 DIAGNOSIS — J9601 Acute respiratory failure with hypoxia: Secondary | ICD-10-CM | POA: Insufficient documentation

## 2014-11-01 ENCOUNTER — Encounter: Payer: Self-pay | Admitting: Family Medicine

## 2014-11-03 ENCOUNTER — Encounter: Payer: Self-pay | Admitting: Family Medicine

## 2014-11-03 DIAGNOSIS — R7309 Other abnormal glucose: Secondary | ICD-10-CM

## 2014-11-03 DIAGNOSIS — D72829 Elevated white blood cell count, unspecified: Secondary | ICD-10-CM

## 2014-11-03 DIAGNOSIS — R748 Abnormal levels of other serum enzymes: Secondary | ICD-10-CM

## 2014-11-08 NOTE — Addendum Note (Signed)
Addended by: Beatrice Lecher D on: 11/08/2014 08:45 AM   Modules accepted: Orders

## 2014-11-09 LAB — CBC WITH DIFFERENTIAL/PLATELET
BASOS ABS: 0.1 10*3/uL (ref 0.0–0.1)
Basophils Relative: 1 % (ref 0–1)
EOS PCT: 5 % (ref 0–5)
Eosinophils Absolute: 0.4 10*3/uL (ref 0.0–0.7)
HCT: 42.1 % (ref 36.0–46.0)
Hemoglobin: 14 g/dL (ref 12.0–15.0)
LYMPHS PCT: 32 % (ref 12–46)
Lymphs Abs: 2.7 10*3/uL (ref 0.7–4.0)
MCH: 28 pg (ref 26.0–34.0)
MCHC: 33.3 g/dL (ref 30.0–36.0)
MCV: 84.2 fL (ref 78.0–100.0)
MPV: 9.7 fL (ref 8.6–12.4)
Monocytes Absolute: 0.4 10*3/uL (ref 0.1–1.0)
Monocytes Relative: 5 % (ref 3–12)
Neutro Abs: 4.7 10*3/uL (ref 1.7–7.7)
Neutrophils Relative %: 57 % (ref 43–77)
PLATELETS: 355 10*3/uL (ref 150–400)
RBC: 5 MIL/uL (ref 3.87–5.11)
RDW: 13.8 % (ref 11.5–15.5)
WBC: 8.3 10*3/uL (ref 4.0–10.5)

## 2014-11-09 LAB — HEMOGLOBIN A1C
Hgb A1c MFr Bld: 5.8 % — ABNORMAL HIGH (ref <5.7)
Mean Plasma Glucose: 120 mg/dL — ABNORMAL HIGH (ref <117)

## 2014-11-09 LAB — CK: Total CK: 53 U/L (ref 7–177)

## 2014-11-11 IMAGING — CT CT ABD-PELV W/ CM
2 of 4 series · 16 of 46 positions shown, 18 images · IV contrast (APPLIED)
Comparison: None.

CLINICAL DATA: Right lower abdominal pain, right flank pain.

CT ABDOMEN AND PELVIS WITH CONTRAST
TECHNIQUE: Multidetector CT imaging of the abdomen and pelvis was
performed following the standard protocol during bolus
administration of intravenous contrast.
Contrast: 100 ml [id number]

[Series 2: abd/pelvis 5.0 b31f · axial · 0.97mm/px · z∈[-466,+4]mm · 13 of 104 slices shown, 15 images]
[im 5/104  soft-tissue]
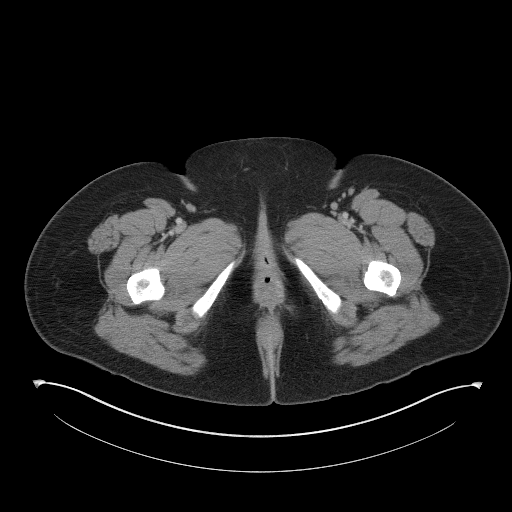
[im 5/104  bone]
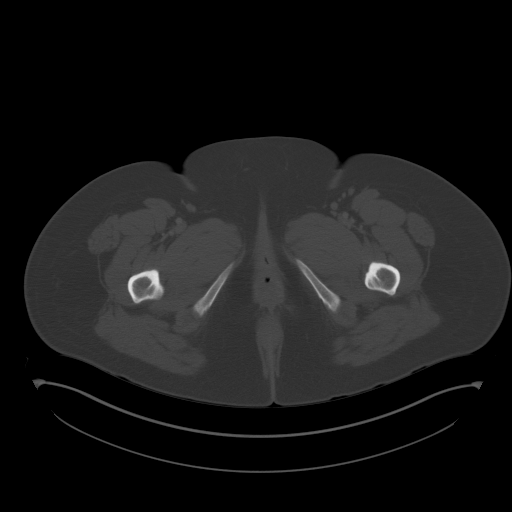
[im 15/104  soft-tissue]
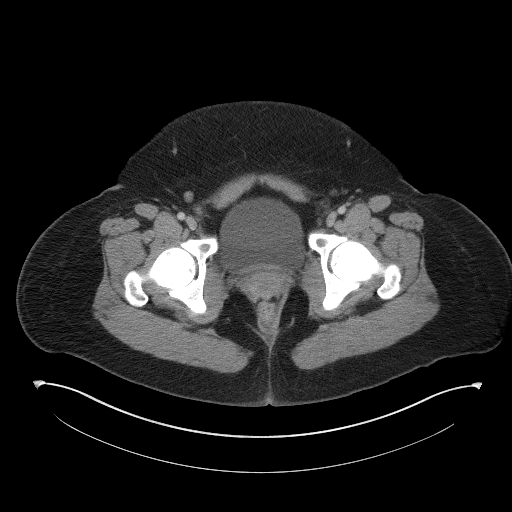
[im 24/104  soft-tissue]
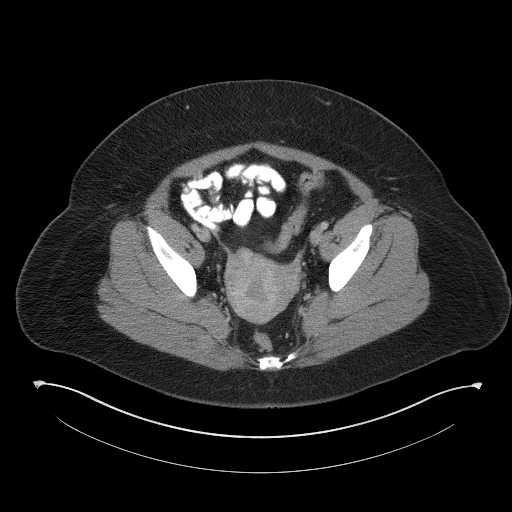
[im 29/104  soft-tissue]
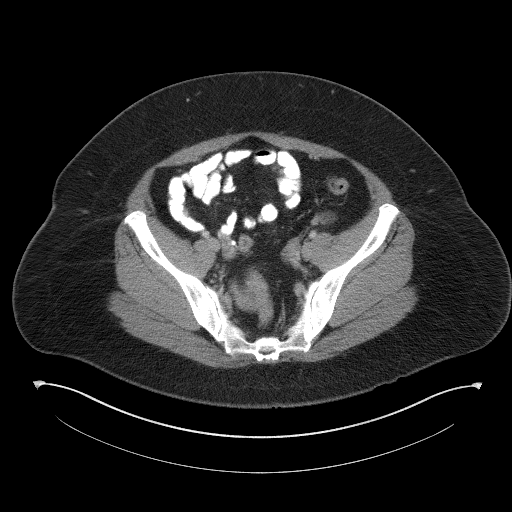
[im 38/104  soft-tissue]
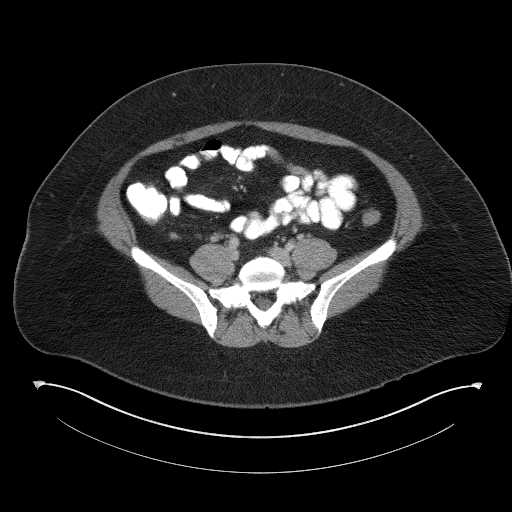
[im 43/104  soft-tissue]
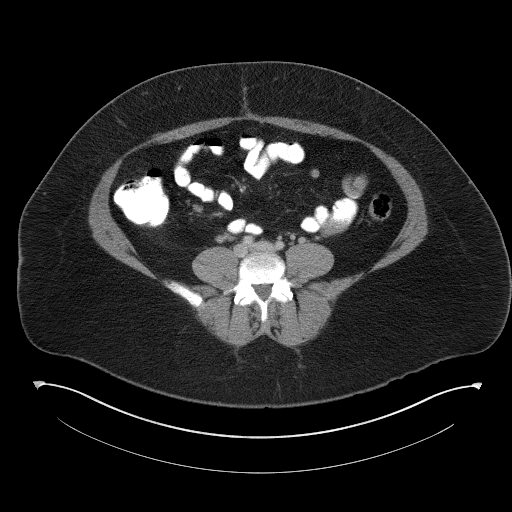
[im 52/104  soft-tissue]
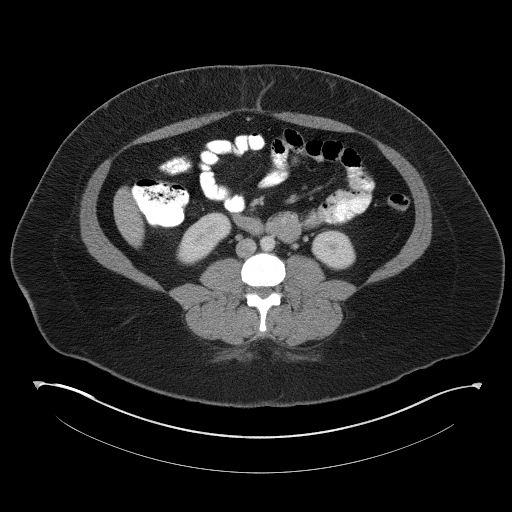
[im 61/104  soft-tissue]
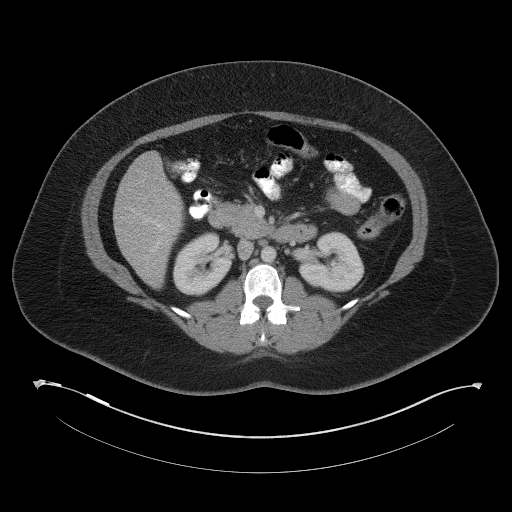
[im 66/104  soft-tissue]
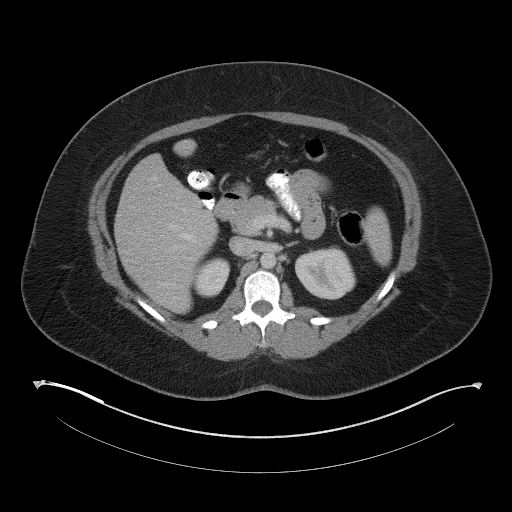
[im 66/104  bone]
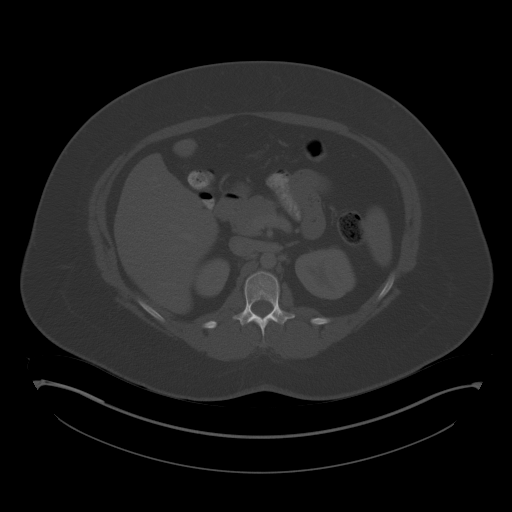
[im 75/104  soft-tissue]
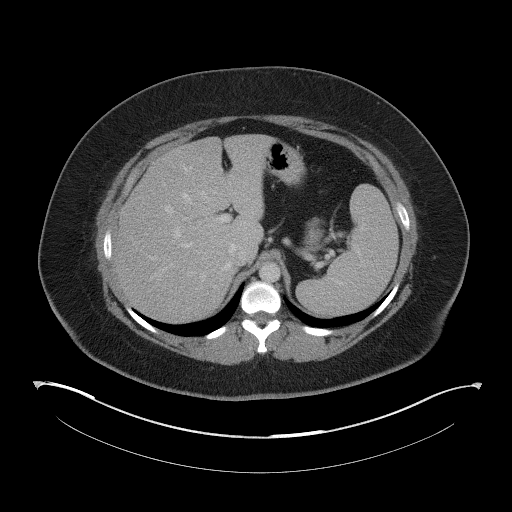
[im 80/104  soft-tissue]
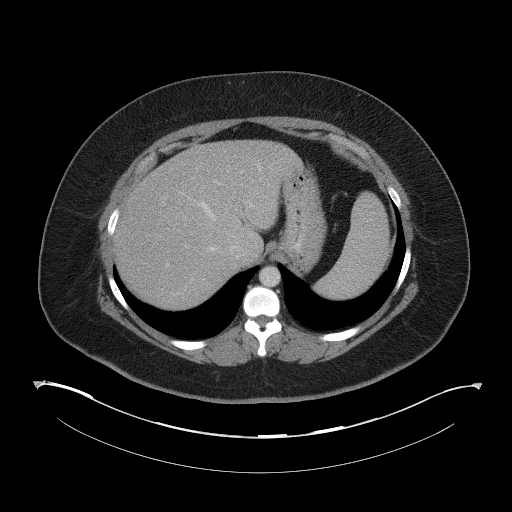
[im 89/104  soft-tissue]
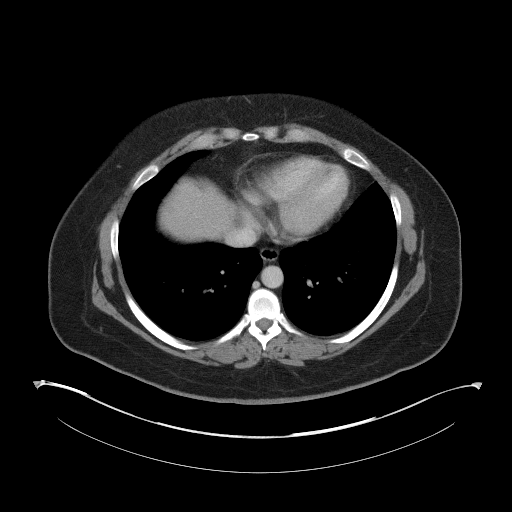
[im 99/104  soft-tissue]
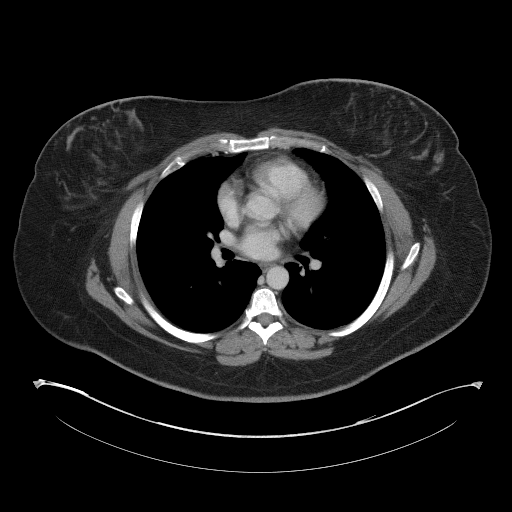

[Series 5: abd/pelvis 3.0 coronal · coronal · 1.05mm/px · 3 of 97 slices shown]
[im 33/97  soft-tissue]
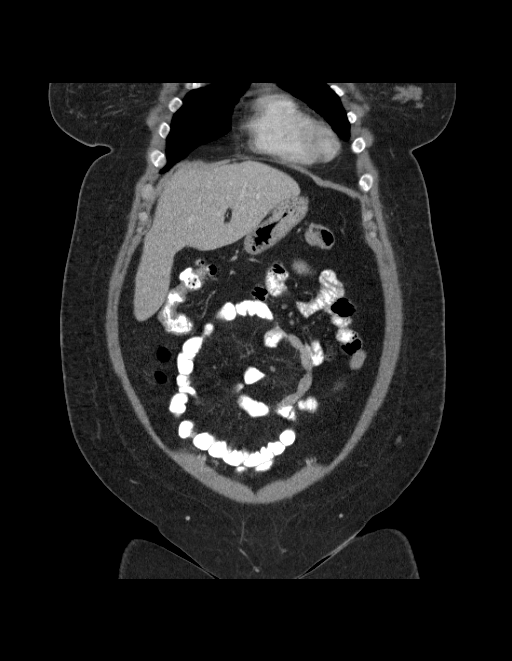
[im 43/97  soft-tissue]
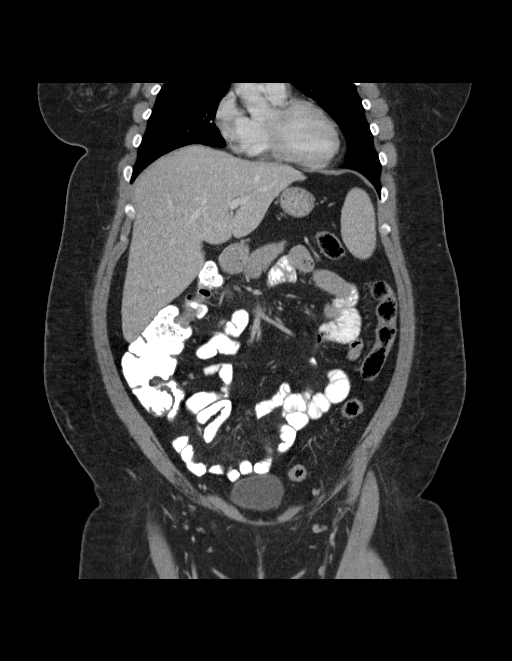
[im 54/97  soft-tissue]
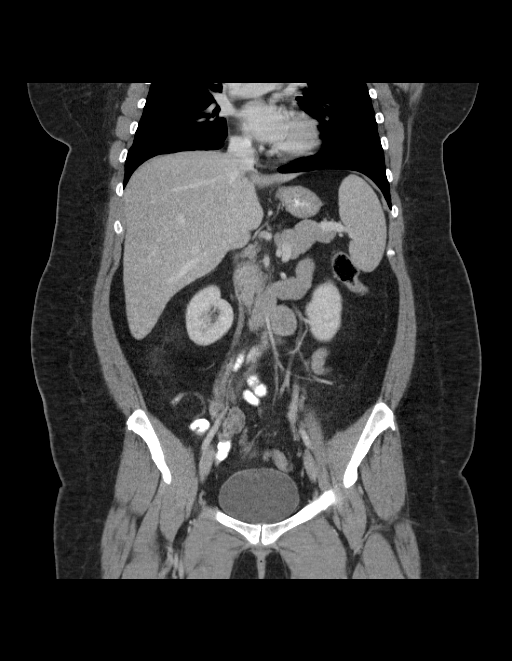

[16 of 46 positions shown; findings below may reference images not displayed]

FINDINGS: Lung bases are clear.  No effusions.  Heart is normal
size.

Prior cholecystectomy.  Liver, spleen, pancreas, adrenals and
kidneys are normal.  Appendix is visualized and is normal.  No
abnormal areas of enhancement within the kidneys to suggest
pyelonephritis.  However, recommend clinical correlation to
completely exclude pyelonephritis.  No hydronephrosis.  No renal or
ureteral stones.

Uterus, adnexa and urinary bladder are unremarkable.  Bilateral
tubal ligation clips noted.  No free fluid, free air or adenopathy.

There is a short jejunal small bowel intussusception in the left
lower abdomen, likely transient and incidental.  Large and small
bowel otherwise unremarkable.  Aorta is normal caliber.

No acute bony abnormality.
IMPRESSION: Short segment jejunal small bowel intussusception in the left lower
abdomen, likely incidental and transient.

Appendix normal.

## 2014-11-24 ENCOUNTER — Telehealth: Payer: Self-pay | Admitting: *Deleted

## 2014-11-24 DIAGNOSIS — Z9189 Other specified personal risk factors, not elsewhere classified: Secondary | ICD-10-CM

## 2014-11-24 NOTE — Telephone Encounter (Signed)
We can do an in home study or sleep lab study. The labs study is a little more detailed.  The home study is basic but will tell us if she has apnea  Or not and is less expensive. It may also come down to what her insurance will cover too.See if she has a preference.

## 2014-11-24 NOTE — Telephone Encounter (Signed)
Pt informed. Pt would like to do a home sleep study. Order placed.Maryruth Eve, [place] Crocker

## 2014-11-24 NOTE — Telephone Encounter (Signed)
Pt called and stated that she was seen by endo and was told that she would need a sleep study done to rule out or confirm that she has sleep apnea. She states that she was told this after she had her thyroid surgery done. Will fwd to pcp for f/u.Audelia Hives [place]

## 2014-12-06 ENCOUNTER — Other Ambulatory Visit: Payer: Self-pay

## 2014-12-06 ENCOUNTER — Encounter: Payer: Self-pay | Admitting: Family Medicine

## 2014-12-06 DIAGNOSIS — Z9189 Other specified personal risk factors, not elsewhere classified: Secondary | ICD-10-CM

## 2014-12-29 ENCOUNTER — Encounter: Payer: Self-pay | Admitting: Family Medicine

## 2014-12-29 ENCOUNTER — Ambulatory Visit (INDEPENDENT_AMBULATORY_CARE_PROVIDER_SITE_OTHER): Payer: BLUE CROSS/BLUE SHIELD | Admitting: Family Medicine

## 2014-12-29 VITALS — BP 135/91 | HR 105 | Temp 99.3°F | Wt 247.0 lb

## 2014-12-29 DIAGNOSIS — J029 Acute pharyngitis, unspecified: Secondary | ICD-10-CM

## 2014-12-29 DIAGNOSIS — A499 Bacterial infection, unspecified: Secondary | ICD-10-CM

## 2014-12-29 DIAGNOSIS — B9689 Other specified bacterial agents as the cause of diseases classified elsewhere: Secondary | ICD-10-CM

## 2014-12-29 DIAGNOSIS — J329 Chronic sinusitis, unspecified: Secondary | ICD-10-CM

## 2014-12-29 LAB — POCT RAPID STREP A (OFFICE): RAPID STREP A SCREEN: NEGATIVE

## 2014-12-29 MED ORDER — DOXYCYCLINE HYCLATE 100 MG PO TABS: ORAL_TABLET | ORAL | Status: AC

## 2014-12-29 MED ORDER — HYDROCODONE-HOMATROPINE 5-1.5 MG/5ML PO SYRP: 5.0000 mL | ORAL_SOLUTION | Freq: Three times a day (TID) | ORAL | Status: DC | PRN
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
## 2014-12-31 ENCOUNTER — Encounter: Payer: Self-pay | Admitting: Physician Assistant

## 2014-12-31 DIAGNOSIS — E892 Postprocedural hypoparathyroidism: Secondary | ICD-10-CM | POA: Insufficient documentation

## 2014-12-31 DIAGNOSIS — E063 Autoimmune thyroiditis: Secondary | ICD-10-CM | POA: Insufficient documentation

## 2015-01-06 DIAGNOSIS — G4710 Hypersomnia, unspecified: Secondary | ICD-10-CM | POA: Insufficient documentation

## 2015-02-11 ENCOUNTER — Encounter: Payer: Self-pay | Admitting: Family Medicine

## 2015-02-15 ENCOUNTER — Ambulatory Visit (INDEPENDENT_AMBULATORY_CARE_PROVIDER_SITE_OTHER): Payer: BLUE CROSS/BLUE SHIELD | Admitting: Family Medicine

## 2015-02-15 ENCOUNTER — Encounter: Payer: Self-pay | Admitting: Family Medicine

## 2015-02-15 VITALS — BP 102/61 | HR 77 | Wt 250.0 lb

## 2015-02-15 DIAGNOSIS — J309 Allergic rhinitis, unspecified: Secondary | ICD-10-CM

## 2015-02-15 DIAGNOSIS — R109 Unspecified abdominal pain: Secondary | ICD-10-CM | POA: Diagnosis not present

## 2015-02-15 DIAGNOSIS — Z13220 Encounter for screening for lipoid disorders: Secondary | ICD-10-CM | POA: Diagnosis not present

## 2015-02-15 DIAGNOSIS — E039 Hypothyroidism, unspecified: Secondary | ICD-10-CM

## 2015-02-15 DIAGNOSIS — G4733 Obstructive sleep apnea (adult) (pediatric): Secondary | ICD-10-CM

## 2015-02-15 MED ORDER — AMBULATORY NON FORMULARY MEDICATION: Status: DC

## 2015-02-15 MED ORDER — BECLOMETHASONE DIPROPIONATE 80 MCG/ACT NA AERS: 2.0000 | INHALATION_SPRAY | Freq: Every day | NASAL | Status: DC

## 2015-02-15 NOTE — Progress Notes (Signed)
   Subjective:    Patient ID: Desiree Jackson, female    DOB: 08-04-1981, 34 y.o.   MRN: [id number]  HPI Patient had her thyroid nodule removed and biopsy was negative. She has done well since then with the healing process. She did have some problem's initially. She was observed to have some possible apneas during sedation for the surgery procedure. She was then [facility] here in [place]. They did a home sleep study. It did show an apnea hypopnia index greater than 5 and they recommended treatment with CPAP. In addition her oxygen level was below 89% for 44 minutes during the test. Note she was sick during the evaluation and had the flu and was coughing a lot. Unfortunately she had a lot of difficulty with the pulmonary office getting back in touch with her to make appointments and to schedule her test. Thus she had the results faxed to our office so that we could review them together and decide what needed to be done next. Next  She also has a question about abdominal cramps. She says it feels like the muscle wall will cramp if she turns in just the right position. No other known triggers. She's never had any palms with electrolytes. She denies any palms with cramping in the calves or in the feet. It will persist for several minutes when it happens.  She would also like to try something prescription for her allergies. She's tried almost every over-the-counter product and only provides minimal relief. She has significant nasal congestion.  Review of Systems     Objective:   Physical Exam  Constitutional: She is oriented to person, place, and time. She appears well-developed and well-nourished.  HENT:  Head: Normocephalic and atraumatic.  Eyes: Conjunctivae and EOM are normal.  Cardiovascular: Normal rate.   Pulmonary/Chest: Effort normal.  Neurological: She is alert and oriented to person, place, and time.  Skin: Skin is dry. No pallor.  Psychiatric: She  has a normal mood and affect. Her behavior is normal.          Assessment & Plan:  Obstructive sleep apnea-based on her results I do think she needs to start CPAP. I'll be happy to place the order for her. We discussed that we typically will set her on AutoPap for the first 2 weeks and then download the information so that we can set her CPAP. I will see her back in one month to make sure that she's doing well and we can make adjustments at that time. Certainly she has any issues with fitting of the mask itself and I encouraged her to contact the medical supply company directly.  Abdominal cramps-unclear etiology. She has had a couple children which does tend to stretch the abdominal muscles so that they are more loose and sometimes will cramp more easily especially with position change. We could consider checking for electrolyte disturbances etc. that I think this is unlikely as this tends to cause more lower extremity cramping. Otherwise I don't have a great reason. She really hasn't changed her exercise routine..   Allergic rhinitis-recommend a trial of daily nasal. Sample provided and coupon card given. If this is not effective within a couple of weeks of use and please let us know. She might even benefit from allergy testing.

## 2015-02-17 ENCOUNTER — Encounter: Payer: Self-pay | Admitting: Family Medicine

## 2015-02-17 DIAGNOSIS — G4733 Obstructive sleep apnea (adult) (pediatric): Secondary | ICD-10-CM | POA: Insufficient documentation

## 2015-02-17 LAB — COMPLETE METABOLIC PANEL WITH GFR
ALK PHOS: 67 U/L (ref 39–117)
ALT: 23 U/L (ref 0–35)
AST: 18 U/L (ref 0–37)
Albumin: 3.8 g/dL (ref 3.5–5.2)
BUN: 12 mg/dL (ref 6–23)
CO2: 29 mEq/L (ref 19–32)
CREATININE: 0.83 mg/dL (ref 0.50–1.10)
Calcium: 9.6 mg/dL (ref 8.4–10.5)
Chloride: 103 mEq/L (ref 96–112)
GFR, Est African American: >89 mL/min
GFR, Est Non African American: >89 mL/min
Glucose, Bld: 89 mg/dL (ref 70–99)
Potassium: 4.5 mEq/L (ref 3.5–5.3)
Sodium: 142 mEq/L (ref 135–145)
Total Bilirubin: 0.4 mg/dL (ref 0.2–1.2)
Total Protein: 6.7 g/dL (ref 6.0–8.3)

## 2015-02-17 LAB — LIPID PANEL
Cholesterol: 179 mg/dL (ref 0–200)
HDL: 34 mg/dL — ABNORMAL LOW (ref >=46)
LDL CALC: 93 mg/dL (ref 0–99)
Total CHOL/HDL Ratio: 5.3 Ratio
Triglycerides: 259 mg/dL — ABNORMAL HIGH (ref <150)
VLDL: 52 mg/dL — AB (ref 0–40)

## 2015-02-17 LAB — TSH: TSH: 0.81 u[IU]/mL (ref 0.350–4.500)

## 2015-02-18 ENCOUNTER — Encounter: Payer: Self-pay | Admitting: *Deleted

## 2015-03-03 ENCOUNTER — Emergency Department (INDEPENDENT_AMBULATORY_CARE_PROVIDER_SITE_OTHER)
Admission: EM | Admit: 2015-03-03 | Discharge: 2015-03-03 | Disposition: A | Discharge status: Home / Self Care | Payer: BLUE CROSS/BLUE SHIELD | Source: Home / Self Care | Attending: Family Medicine | Admitting: Family Medicine

## 2015-03-03 ENCOUNTER — Encounter: Payer: Self-pay | Admitting: Emergency Medicine

## 2015-03-03 ENCOUNTER — Encounter: Payer: Self-pay | Admitting: Family Medicine

## 2015-03-03 DIAGNOSIS — M25422 Effusion, left elbow: Secondary | ICD-10-CM

## 2015-03-03 DIAGNOSIS — L03114 Cellulitis of left upper limb: Secondary | ICD-10-CM | POA: Diagnosis not present

## 2015-03-03 MED ORDER — SULFAMETHOXAZOLE-TRIMETHOPRIM 800-160 MG PO TABS: 1.0000 | ORAL_TABLET | Freq: Two times a day (BID) | ORAL | Status: AC

## 2015-03-03 NOTE — Consult Note (Signed)
   Subjective:    I'm seeing this patient as a consultation for:  Noland Fordyce PA-C  CC: Left elbow pain  HPI: This is a pleasant 34 year old female, for several days she's had increasing pain and swelling in the posterior left elbow after an insect bite, she is unaware as to what insect bit her. She has been on doxycycline for 2 days without any improvement. Unfortunately she's having difficulty with range of motion of the elbow, and I was called for further evaluation and assistance in determining whether a septic joint is present. Pain is severe, persistent without radiation. Localizes in the posterior elbow just proximal to the Allegra not.  Past medical history, Surgical history, Family history not pertinant except as noted below, Social history, Allergies, and medications have been entered into the medical record, reviewed, and no changes needed.   Review of Systems: No headache, visual changes, nausea, vomiting, diarrhea, constipation, dizziness, abdominal pain, skin rash, fevers, chills, night sweats, weight loss, swollen lymph nodes, body aches, joint swelling, muscle aches, chest pain, shortness of breath, mood changes, visual or auditory hallucinations.   Objective:   General: Well Developed, well nourished, and in no acute distress.  Neuro/Psych: Alert and oriented x3, extra-ocular muscles intact, able to move all 4 extremities, sensation grossly intact. Skin: Warm and dry, no rashes noted.  Respiratory: Not using accessory muscles, speaking in full sentences, trachea midline.  Cardiovascular: Pulses palpable, no extremity edema. Abdomen: Does not appear distended. Left Elbow: Visible erythema over the triceps, no induration, no drainage. She is tender to palpation over the entire elbow. Range of motion full pronation, supination, flexion, extension. Strength is full to all of the above directions Stable to varus, valgus stress. Negative moving valgus stress test. No discrete  areas of tenderness to palpation. Ulnar nerve does not sublux. Negative cubital tunnel Tinel's.  Procedure: Diagnostic Ultrasound of  left elbow Device: GE Logiq E  Findings: Visualize joint, no evidence of effusion, also no evidence of subcutaneous collection Images permanently stored and available for review in the ultrasound unit.  Impression: Negative for septic joint or elbow joint effusion. Negative for infected fluid collection.  Impression and Recommendations:   This case required medical decision making of moderate complexity.  1. Elbow swelling: Likely cellulitis, no ultrasonic evidence of effusion or drainable collection. Further management per urgent care PA-C. I would recommend switching to Septra double strength twice a day for 7 days, also given topical diclofenac, and ibuprofen 800 to be used 3 times a day. Return to see primary care provider in a week.  ___________________________________________ Gwen Her. Dianah Field, M.D., ABFM., CAQSM. Primary Care and [place] Instructor of [employer] [facility] [employer]
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
## 2015-03-03 NOTE — ED Notes (Signed)
Insect bite x 2 days ago on upper left arm.  Was seen in ED, was treated with Doxy and zofran, patient says the redness, pain, burning, itching has gotten worse. This morning had fever of 103 took 800mg s of ibuprofen, nauseated.

## 2015-03-06 ENCOUNTER — Telehealth: Payer: Self-pay | Admitting: Emergency Medicine

## 2015-03-17 ENCOUNTER — Ambulatory Visit: Payer: BLUE CROSS/BLUE SHIELD | Admitting: Family Medicine

## 2015-03-18 ENCOUNTER — Encounter: Payer: Self-pay | Admitting: Family Medicine

## 2015-04-14 ENCOUNTER — Encounter: Payer: Self-pay | Admitting: Osteopathic Medicine

## 2015-04-14 ENCOUNTER — Ambulatory Visit (INDEPENDENT_AMBULATORY_CARE_PROVIDER_SITE_OTHER): Payer: BLUE CROSS/BLUE SHIELD | Admitting: Osteopathic Medicine

## 2015-04-14 VITALS — BP 123/87 | HR 70 | Temp 98.4°F | Ht 62.0 in | Wt 250.0 lb

## 2015-04-14 DIAGNOSIS — R05 Cough: Secondary | ICD-10-CM | POA: Diagnosis not present

## 2015-04-14 DIAGNOSIS — R0981 Nasal congestion: Secondary | ICD-10-CM

## 2015-04-14 DIAGNOSIS — R059 Cough, unspecified: Secondary | ICD-10-CM

## 2015-04-14 DIAGNOSIS — B349 Viral infection, unspecified: Secondary | ICD-10-CM | POA: Diagnosis not present

## 2015-04-14 DIAGNOSIS — B9789 Other viral agents as the cause of diseases classified elsewhere: Secondary | ICD-10-CM

## 2015-04-14 DIAGNOSIS — J329 Chronic sinusitis, unspecified: Secondary | ICD-10-CM | POA: Diagnosis not present

## 2015-04-14 MED ORDER — FLUTICASONE PROPIONATE 50 MCG/ACT NA SUSP: 2.0000 | Freq: Every day | NASAL | Status: DC

## 2015-04-14 MED ORDER — BENZOCAINE-MENTHOL 10-2.1 MG MT LOZG: 1.0000 | LOZENGE | OROMUCOSAL | Status: DC | PRN

## 2015-04-14 MED ORDER — OXYMETAZOLINE HCL 0.05 % NA SOLN: 1.0000 | Freq: Two times a day (BID) | NASAL | Status: DC
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
## 2015-04-14 NOTE — Patient Instructions (Signed)

## 2015-04-25 ENCOUNTER — Encounter: Payer: Self-pay | Admitting: Family Medicine

## 2015-04-27 ENCOUNTER — Encounter: Payer: Self-pay | Admitting: Family Medicine

## 2015-04-27 ENCOUNTER — Ambulatory Visit (INDEPENDENT_AMBULATORY_CARE_PROVIDER_SITE_OTHER): Payer: BLUE CROSS/BLUE SHIELD | Admitting: Family Medicine

## 2015-04-27 VITALS — BP 118/79 | HR 60 | Ht 62.0 in | Wt 250.0 lb

## 2015-04-27 DIAGNOSIS — J309 Allergic rhinitis, unspecified: Secondary | ICD-10-CM

## 2015-04-27 DIAGNOSIS — Z9989 Dependence on other enabling machines and devices: Secondary | ICD-10-CM

## 2015-04-27 DIAGNOSIS — J0110 Acute frontal sinusitis, unspecified: Secondary | ICD-10-CM

## 2015-04-27 DIAGNOSIS — G4733 Obstructive sleep apnea (adult) (pediatric): Secondary | ICD-10-CM

## 2015-04-27 MED ORDER — AMBULATORY NON FORMULARY MEDICATION: Status: DC

## 2015-04-27 MED ORDER — AZITHROMYCIN 250 MG PO TABS: ORAL_TABLET | ORAL | Status: DC

## 2015-04-27 NOTE — Addendum Note (Signed)
Addended by: Beatrice Lecher D on: 04/27/2015 11:30 AM   Modules accepted: Orders
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
## 2015-05-03 ENCOUNTER — Other Ambulatory Visit: Payer: Self-pay | Admitting: Obstetrics & Gynecology

## 2015-05-03 ENCOUNTER — Encounter: Payer: Self-pay | Admitting: Family Medicine

## 2015-05-05 LAB — ALLERGEN FOOD PROFILE SPECIFIC IGE
Egg White IgE: <0.1 kU/L
Fish Cod: 0.32 kU/L — ABNORMAL HIGH
IgE (Immunoglobulin E), Serum: 200 kU/L — ABNORMAL HIGH (ref <115)
Shrimp IgE: 1.3 kU/L — ABNORMAL HIGH
Soybean IgE: <0.1 kU/L
Tuna IgE: 0.36 kU/L — ABNORMAL HIGH
Wheat IgE: <0.1 kU/L

## 2015-05-05 LAB — RESPIRATORY ALLERGY PROFILE REGION II ~~LOC~~
ALLERGEN, D PTERNOYSSINUS, D1: 10.6 kU/L — AB
ALTERNARIA ALTERNATA: 1.36 kU/L — AB
Allergen, Cedar tree, t12: 0.11 kU/L — ABNORMAL HIGH
Allergen, Comm Silver Birch, t9: 0.54 kU/L — ABNORMAL HIGH
Allergen, Cottonwood, t14: 0.25 kU/L — ABNORMAL HIGH
Allergen, Mouse Urine Protein, e78: <0.1 kU/L
Allergen, Mulberry, t76: 0.15 kU/L — ABNORMAL HIGH
Aspergillus fumigatus, m3: <0.1 kU/L
BERMUDA GRASS: 0.73 kU/L — AB
Box Elder IgE: 0.24 kU/L — ABNORMAL HIGH
COCKROACH: 0.67 kU/L — AB
Cat Dander: 0.1 kU/L — ABNORMAL HIGH
Common Ragweed: 0.2 kU/L — ABNORMAL HIGH
D. FARINAE: 11.4 kU/L — AB
DOG DANDER: 0.14 kU/L — AB
ELM IGE: 0.53 kU/L — AB
JOHNSON GRASS: 1.63 kU/L — AB
OAK CLASS: 0.33 kU/L — AB
PECAN/HICKORY TREE IGE: 0.31 kU/L — AB
Penicillium Notatum: <0.1 kU/L
Rough Pigweed  IgE: 0.51 kU/L — ABNORMAL HIGH
Sheep Sorrel IgE: 0.37 kU/L — ABNORMAL HIGH
Timothy Grass: 9.3 kU/L — ABNORMAL HIGH

## 2015-05-06 ENCOUNTER — Encounter: Payer: Self-pay | Admitting: Family Medicine

## 2015-05-06 ENCOUNTER — Ambulatory Visit (INDEPENDENT_AMBULATORY_CARE_PROVIDER_SITE_OTHER): Payer: BLUE CROSS/BLUE SHIELD

## 2015-05-06 ENCOUNTER — Ambulatory Visit (INDEPENDENT_AMBULATORY_CARE_PROVIDER_SITE_OTHER): Payer: BLUE CROSS/BLUE SHIELD | Admitting: Family Medicine

## 2015-05-06 DIAGNOSIS — M542 Cervicalgia: Secondary | ICD-10-CM | POA: Diagnosis not present

## 2015-05-06 MED ORDER — CYCLOBENZAPRINE HCL 10 MG PO TABS: 10.0000 mg | ORAL_TABLET | Freq: Three times a day (TID) | ORAL | Status: DC | PRN

## 2015-05-06 MED ORDER — PREDNISONE 5 MG (48) PO TBPK: ORAL_TABLET | ORAL | Status: DC

## 2015-05-06 NOTE — Patient Instructions (Addendum)
Thank you for coming in today. Return in a few weeks if not better. Attend physical therapy. Take prednisone daily. Use Flexeril mostly at bedtime for muscle spasms. This medicine will make you sleepy. Come back or go to the emergency room if you notice new weakness new numbness problems walking or bowel or bladder problems.  Cervical Radiculopathy Cervical radiculopathy happens when a nerve in the neck is pinched or bruised by a slipped (herniated) disk or by arthritic changes in the bones of the cervical spine. This can occur due to an injury or as part of the normal aging process. Pressure on the cervical nerves can cause pain or numbness that runs from your neck all the way down into your arm and fingers. CAUSES  There are many possible causes, including:  Injury.  Muscle tightness in the neck from overuse.  Swollen, painful joints (arthritis).  Breakdown or degeneration in the bones and joints of the spine (spondylosis) due to aging.  Bone spurs that may develop near the cervical nerves. SYMPTOMS  Symptoms include pain, weakness, or numbness in the affected arm and hand. Pain can be severe or irritating. Symptoms may be worse when extending or turning the neck. DIAGNOSIS  Your caregiver will ask about your symptoms and do a physical exam. He or she may test your strength and reflexes. X-rays, CT scans, and MRI scans may be needed in cases of injury or if the symptoms do not go away after a period of time. Electromyography (EMG) or nerve conduction testing may be done to study how your nerves and muscles are working. TREATMENT  Your caregiver may recommend certain exercises to help relieve your symptoms. Cervical radiculopathy can, and often does, get better with time and treatment. If your problems continue, treatment options may include:  Wearing a soft collar for short periods of time.  Physical therapy to strengthen the neck muscles.  Medicines, such as nonsteroidal  anti-inflammatory drugs (NSAIDs), oral corticosteroids, or spinal injections.  Surgery. Different types of surgery may be done depending on the cause of your problems. HOME CARE INSTRUCTIONS   Put ice on the affected area.  Put ice in a plastic bag.  Place a towel between your skin and the bag.  Leave the ice on for 15-20 minutes, 03-04 times a day or as directed by your caregiver.  If ice does not help, you can try using heat. Take a warm shower or bath, or use a hot water bottle as directed by your caregiver.  You may try a gentle neck and shoulder massage.  Use a flat pillow when you sleep.  Only take over-the-counter or prescription medicines for pain, discomfort, or fever as directed by your caregiver.  If physical therapy was prescribed, follow your caregiver's directions.  If a soft collar was prescribed, use it as directed. SEEK IMMEDIATE MEDICAL CARE IF:   Your pain gets much worse and cannot be controlled with medicines.  You have weakness or numbness in your hand, arm, face, or leg.  You have a high fever or a stiff, rigid neck.  You lose bowel or bladder control (incontinence).  You have trouble with walking, balance, or speaking. MAKE SURE YOU:   Understand these instructions.  Will watch your condition.  Will get help right away if you are not doing well or get worse. Document Released: 05/08/2001 Document Revised: 11/05/2011 Document Reviewed: 03/27/2011 [facility] Patient Information 2015 [place], [place]. This information is not intended to replace advice given to you by your health  care provider. Make sure you discuss any questions you have with your health care provider. c4

## 2015-05-06 NOTE — Assessment & Plan Note (Signed)
New exacerbation of existing symptom. I believe the majority of patients pain is due to cervical myofascial dysfunction and spasm. She does have some radicular component to her left arm as well. Plan for prednisone Dosepak and Flexeril. Additionally refer to physical therapy and obtain a cervical spine x-ray today. Return in a few weeks Discuss red flags signs and symptoms.

## 2015-05-06 NOTE — Progress Notes (Signed)
   Subjective:    I'm seeing this patient as a consultation for:  Dr. Madilyn Fireman  CC: Neck pain  HPI: Patient notes a one-week history of posterior neck pain occurring bilaterally. The pain is located in the posterior scalp and into the midportion of the neck. She does note some pain and tingling sensation radiating down her left arm to the hand. She denies any weakness or numbness fevers or chills nausea vomiting or diarrhea. She denies any injury. She tried some ibuprofen which helps a little. She feels well otherwise.  Past medical history, Surgical history, Family history not pertinant except as noted below, Social history, Allergies, and medications have been entered into the medical record, reviewed, and no changes needed.   Review of Systems: No headache, visual changes, nausea, vomiting, diarrhea, constipation, dizziness, abdominal pain, skin rash, fevers, chills, night sweats, weight loss, swollen lymph nodes, body aches, joint swelling, muscle aches, chest pain, shortness of breath, mood changes, visual or auditory hallucinations.   Objective:    Filed Vitals:   05/06/15 1038  BP: 133/87  Pulse: 78   General: Well Developed, well nourished, and in no acute distress.  Neuro/Psych: Alert and oriented x3, extra-ocular muscles intact, able to move all 4 extremities, sensation grossly intact. Skin: Warm and dry, no rashes noted.  Respiratory: Not using accessory muscles, speaking in full sentences, trachea midline.  Cardiovascular: Pulses palpable, no extremity edema. Abdomen: Does not appear distended. MSK: Neck: Nontender to midline. Tender to palpation bilateral cervical paraspinals. Normal neck range of motion negative Spurling's test. Upper extremity strength is equal and normal bilaterally. Reflexes are intact and equal bilateral upper extremities. Sensation is intact throughout. Normal gait.  Cervical spine x-ray pending  No results found for this or any previous visit  (from the past 24 hour(s)). No results found.  Impression and Recommendations:   This case required medical decision making of moderate complexity.

## 2015-05-06 NOTE — Progress Notes (Signed)
Quick Note:  Xray is as expected. No drastic changes. ______

## 2015-05-09 ENCOUNTER — Encounter: Payer: Self-pay | Admitting: *Deleted

## 2015-05-09 ENCOUNTER — Other Ambulatory Visit: Payer: Self-pay | Admitting: *Deleted

## 2015-05-09 ENCOUNTER — Encounter: Payer: Self-pay | Admitting: Family Medicine

## 2015-05-09 DIAGNOSIS — L900 Lichen sclerosus et atrophicus: Secondary | ICD-10-CM

## 2015-05-09 DIAGNOSIS — Z91018 Allergy to other foods: Secondary | ICD-10-CM

## 2015-05-09 MED ORDER — CLOBETASOL PROPIONATE 0.05 % EX CREA: 1.0000 "application " | TOPICAL_CREAM | CUTANEOUS | Status: DC

## 2015-05-10 NOTE — Telephone Encounter (Signed)
Please call her DME supplier and get them to adjust her CPAP.  We have faxed over a new order twice and they still haven't

## 2015-05-11 ENCOUNTER — Other Ambulatory Visit: Payer: Self-pay

## 2015-05-11 MED ORDER — AMBULATORY NON FORMULARY MEDICATION: Status: DC

## 2015-05-12 ENCOUNTER — Encounter: Payer: Self-pay | Admitting: Family Medicine

## 2015-06-03 ENCOUNTER — Ambulatory Visit: Payer: BLUE CROSS/BLUE SHIELD | Admitting: Family Medicine

## 2015-06-29 ENCOUNTER — Encounter: Payer: Self-pay | Admitting: Family Medicine

## 2015-07-27 ENCOUNTER — Ambulatory Visit: Payer: BLUE CROSS/BLUE SHIELD | Admitting: Family Medicine

## 2015-08-31 ENCOUNTER — Other Ambulatory Visit: Payer: Self-pay | Admitting: *Deleted

## 2015-08-31 DIAGNOSIS — L900 Lichen sclerosus et atrophicus: Secondary | ICD-10-CM

## 2015-08-31 MED ORDER — CLOBETASOL PROPIONATE 0.05 % EX CREA: 1.0000 "application " | TOPICAL_CREAM | CUTANEOUS | Status: DC

## 2015-08-31 NOTE — Progress Notes (Signed)
Message received from Dr. Hulan Fray that pt may have refill of Temovate. Refill e-prescribed.

## 2015-10-10 ENCOUNTER — Other Ambulatory Visit: Payer: Self-pay | Admitting: Family Medicine

## 2015-10-24 ENCOUNTER — Encounter: Payer: Self-pay | Admitting: Family Medicine

## 2015-10-25 ENCOUNTER — Other Ambulatory Visit: Payer: Self-pay | Admitting: Family Medicine

## 2015-10-25 DIAGNOSIS — H6690 Otitis media, unspecified, unspecified ear: Secondary | ICD-10-CM

## 2015-11-07 ENCOUNTER — Encounter: Payer: Self-pay | Admitting: Family Medicine

## 2015-11-07 NOTE — Telephone Encounter (Signed)
Called the patient and let her know a referral had been sent to [facility] and they were to call her to set up appointment. I did give her the # to [facility]

## 2015-12-05 ENCOUNTER — Encounter: Payer: Self-pay | Admitting: Osteopathic Medicine

## 2015-12-05 ENCOUNTER — Ambulatory Visit (INDEPENDENT_AMBULATORY_CARE_PROVIDER_SITE_OTHER): Payer: BLUE CROSS/BLUE SHIELD | Admitting: Osteopathic Medicine

## 2015-12-05 VITALS — BP 138/91 | HR 76 | Temp 97.5°F | Ht 62.0 in | Wt 258.0 lb

## 2015-12-05 DIAGNOSIS — J019 Acute sinusitis, unspecified: Secondary | ICD-10-CM | POA: Diagnosis not present

## 2015-12-05 DIAGNOSIS — H65191 Other acute nonsuppurative otitis media, right ear: Secondary | ICD-10-CM

## 2015-12-05 DIAGNOSIS — H6091 Unspecified otitis externa, right ear: Secondary | ICD-10-CM | POA: Diagnosis not present

## 2015-12-05 MED ORDER — AZITHROMYCIN 250 MG PO TABS: ORAL_TABLET | ORAL | Status: DC

## 2015-12-05 MED ORDER — CIPROFLOXACIN-HYDROCORTISONE 0.2-1 % OT SUSP: 3.0000 [drp] | Freq: Two times a day (BID) | OTIC | Status: DC

## 2015-12-05 NOTE — Addendum Note (Signed)
Addended by: Doree Albee on: 12/05/2015 01:24 PM   Modules accepted: Orders

## 2015-12-05 NOTE — Patient Instructions (Signed)
If no better after antibiotics, or if worse, please let us know!  Otherwise, follow-up as directed by PCP.

## 2015-12-05 NOTE — Progress Notes (Signed)
HPI: Desiree Jackson is a 35 y.o. female who presents to [facility]  today for chief complaint of:  Chief Complaint  Patient presents with  . Ear Pain    right   PAIN IN EAR, CONGESTION . Location: R ear . Quality:  soreness . Assoc signs/symptoms: see ROS . Duration: maybe three weeks totoa, ear pain 1 days . Modifying factors: has tried the following OTC/Rx medications: Ibuprofen and sudafed helping some  . Context:  "sick about 3 weeks." congestion, coughing, runny nose    Past medical, social and family history reviewed. Current medications and allergies reviewed.     Review of Systems: CONSTITUTIONAL: no fever/chills HEAD/EYES/EARS/NOSE/THROAT: yes headache, no vision change or hearing change, mild sore throat CARDIAC: No chest pain/pressure/palpitations RESPIRATORY: yes cough, no shortness of breath GASTROINTESTINAL: no nausea, no vomiting, no abdominal pain, no diarrhea MUSCULOSKELETAL: no myalgia/arthralgia   Exam:  BP 138/91 mmHg  Pulse 76  Temp(Src) 97.5 F (36.4 C) (Oral)  Ht 5\' 2"  (1.575 m)  Wt 258 lb (117.028 kg)  BMI 47.18 kg/m2  LMP 03/06/2013 Constitutional: VSS, see above. General Appearance: alert, well-developed, well-nourished, NAD Eyes: Normal lids and conjunctive, non-icteric sclera, PERRLA Ears, Nose, Mouth, Throat: Normal external inspection ears/nares/mouth/lips/gums, abnormal - R tm (+) red, difficult to totally vizualize due to cerumen, pt unable to tolerate cleaning due to pain TM, MMM;       posterior pharynx without erythema, without exudate Neck: No masses, trachea midline. normal lymph nodes Respiratory: Normal respiratory effort. No  wheeze/rhonchi/rales Cardiovascular: S1/S2 normal, no murmur/rub/gallop auscultated. RRR.    ASSESSMENT/PLAN:  Acute rhinosinusitis - Plan: azithromycin (ZITHROMAX) 250 MG tablet  Acute nonsuppurative otitis media of right ear - Plan: azithromycin (ZITHROMAX) 250  MG tablet  Otitis externa, right - Plan: ciprofloxacin-hydrocortisone (CIPRO HC) otic suspension    Return if symptoms worsen or fail to improve.

## 2016-01-09 ENCOUNTER — Encounter: Payer: Self-pay | Admitting: Family Medicine

## 2016-02-15 DIAGNOSIS — Z148 Genetic carrier of other disease: Secondary | ICD-10-CM | POA: Insufficient documentation

## 2016-02-15 DIAGNOSIS — H3552 Pigmentary retinal dystrophy: Secondary | ICD-10-CM | POA: Insufficient documentation

## 2016-07-10 ENCOUNTER — Telehealth: Payer: BLUE CROSS/BLUE SHIELD | Admitting: Family

## 2016-07-10 DIAGNOSIS — J0100 Acute maxillary sinusitis, unspecified: Secondary | ICD-10-CM

## 2016-07-10 MED ORDER — DOXYCYCLINE HYCLATE 100 MG PO TABS: 100.0000 mg | ORAL_TABLET | Freq: Two times a day (BID) | ORAL | 0 refills | Status: DC

## 2016-07-10 NOTE — Progress Notes (Signed)

## 2016-07-17 ENCOUNTER — Encounter: Payer: Self-pay | Admitting: Family Medicine

## 2016-07-17 ENCOUNTER — Other Ambulatory Visit: Payer: Self-pay | Admitting: *Deleted

## 2016-07-17 MED ORDER — ESOMEPRAZOLE MAGNESIUM 40 MG PO CPDR: DELAYED_RELEASE_CAPSULE | ORAL | 2 refills | Status: DC

## 2016-07-17 NOTE — Progress Notes (Signed)
rx sent to local pharmacy.Desiree Jackson

## 2016-08-29 ENCOUNTER — Other Ambulatory Visit: Payer: Self-pay | Admitting: Obstetrics & Gynecology

## 2016-08-29 DIAGNOSIS — L900 Lichen sclerosus et atrophicus: Secondary | ICD-10-CM

## 2016-11-19 ENCOUNTER — Ambulatory Visit (HOSPITAL_BASED_OUTPATIENT_CLINIC_OR_DEPARTMENT_OTHER)
Admission: RE | Admit: 2016-11-19 | Discharge: 2016-11-19 | Disposition: A | Discharge status: Home / Self Care | Payer: BLUE CROSS/BLUE SHIELD | Source: Ambulatory Visit | Attending: Family Medicine | Admitting: Family Medicine

## 2016-11-19 ENCOUNTER — Encounter: Payer: Self-pay | Admitting: Family Medicine

## 2016-11-19 ENCOUNTER — Ambulatory Visit (INDEPENDENT_AMBULATORY_CARE_PROVIDER_SITE_OTHER): Payer: BLUE CROSS/BLUE SHIELD | Admitting: Family Medicine

## 2016-11-19 VITALS — BP 128/70 | HR 88 | Ht 62.0 in | Wt 261.0 lb

## 2016-11-19 DIAGNOSIS — R1012 Left upper quadrant pain: Secondary | ICD-10-CM

## 2016-11-19 DIAGNOSIS — Z9079 Acquired absence of other genital organ(s): Secondary | ICD-10-CM | POA: Diagnosis not present

## 2016-11-19 DIAGNOSIS — Z9049 Acquired absence of other specified parts of digestive tract: Secondary | ICD-10-CM | POA: Insufficient documentation

## 2016-11-19 DIAGNOSIS — R0789 Other chest pain: Secondary | ICD-10-CM

## 2016-11-19 DIAGNOSIS — I700 Atherosclerosis of aorta: Secondary | ICD-10-CM | POA: Insufficient documentation

## 2016-11-19 DIAGNOSIS — K760 Fatty (change of) liver, not elsewhere classified: Secondary | ICD-10-CM | POA: Insufficient documentation

## 2016-11-19 DIAGNOSIS — E279 Disorder of adrenal gland, unspecified: Secondary | ICD-10-CM | POA: Diagnosis not present

## 2016-11-19 LAB — CBC WITH DIFFERENTIAL/PLATELET
Basophils Absolute: 0 cells/uL (ref 0–200)
Basophils Relative: 0 %
EOS ABS: 192 {cells}/uL (ref 15–500)
Eosinophils Relative: 2 %
HEMATOCRIT: 43.3 % (ref 35.0–45.0)
HEMOGLOBIN: 14.7 g/dL (ref 11.7–15.5)
LYMPHS PCT: 30 %
Lymphs Abs: 2880 cells/uL (ref 850–3900)
MCH: 28.8 pg (ref 27.0–33.0)
MCHC: 33.9 g/dL (ref 32.0–36.0)
MCV: 84.9 fL (ref 80.0–100.0)
MONO ABS: 480 {cells}/uL (ref 200–950)
MPV: 9.1 fL (ref 7.5–12.5)
Monocytes Relative: 5 %
Neutro Abs: 6048 cells/uL (ref 1500–7800)
Neutrophils Relative %: 63 %
Platelets: 387 10*3/uL (ref 140–400)
RBC: 5.1 MIL/uL (ref 3.80–5.10)
RDW: 14.2 % (ref 11.0–15.0)
WBC: 9.6 10*3/uL (ref 3.8–10.8)

## 2016-11-19 LAB — COMPLETE METABOLIC PANEL WITH GFR
ALT: 41 U/L — AB (ref 6–29)
AST: 27 U/L (ref 10–30)
Albumin: 4.1 g/dL (ref 3.6–5.1)
Alkaline Phosphatase: 69 U/L (ref 33–115)
BILIRUBIN TOTAL: 0.5 mg/dL (ref 0.2–1.2)
BUN: 11 mg/dL (ref 7–25)
CALCIUM: 9.5 mg/dL (ref 8.6–10.2)
CHLORIDE: 104 mmol/L (ref 98–110)
CO2: 29 mmol/L (ref 20–31)
CREATININE: 0.97 mg/dL (ref 0.50–1.10)
GFR, Est African American: 86 mL/min (ref >=60)
GFR, Est Non African American: 75 mL/min (ref >=60)
Glucose, Bld: 93 mg/dL (ref 65–99)
Potassium: 4.3 mmol/L (ref 3.5–5.3)
Sodium: 140 mmol/L (ref 135–146)
Total Protein: 6.9 g/dL (ref 6.1–8.1)

## 2016-11-19 LAB — LIPASE: Lipase: 34 U/L (ref 7–60)

## 2016-11-19 LAB — AMYLASE: AMYLASE: 30 U/L (ref 0–105)

## 2016-11-19 MED ORDER — IOPAMIDOL (ISOVUE-300) INJECTION 61%
100.0000 mL | Freq: Once | INTRAVENOUS | Status: AC | PRN
  Administered 2016-11-19: 100 mL via INTRAVENOUS
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
## 2016-11-20 ENCOUNTER — Encounter: Payer: Self-pay | Admitting: Family Medicine

## 2016-11-20 ENCOUNTER — Telehealth: Payer: Self-pay | Admitting: Osteopathic Medicine

## 2016-11-20 MED ORDER — ESOMEPRAZOLE MAGNESIUM 40 MG PO CPDR: 40.0000 mg | DELAYED_RELEASE_CAPSULE | Freq: Two times a day (BID) | ORAL | 0 refills | Status: DC

## 2016-11-20 NOTE — Telephone Encounter (Signed)
As on-call provider for the practice, received call 9:42 PM 11/19/16 from RN at [facility] re: STAT lab results - only significant out of range value was ALT of 41. Amylase and lipase were normal. I reviewed patient's chart briefly, CT was also normal. Will route to PCP.

## 2016-11-22 ENCOUNTER — Other Ambulatory Visit: Payer: Self-pay | Admitting: Family Medicine

## 2016-11-22 DIAGNOSIS — E892 Postprocedural hypoparathyroidism: Secondary | ICD-10-CM

## 2016-11-30 LAB — TSH: TSH: 0.66 mIU/L

## 2016-12-03 LAB — PTH, INTACT AND CALCIUM
CALCIUM: 9.2 mg/dL (ref 8.6–10.2)
PTH: 56 pg/mL (ref 14–64)

## 2016-12-05 ENCOUNTER — Encounter: Payer: Self-pay | Admitting: Family Medicine

## 2016-12-07 ENCOUNTER — Encounter: Payer: Self-pay | Admitting: Internal Medicine

## 2016-12-07 ENCOUNTER — Other Ambulatory Visit: Payer: Self-pay | Admitting: Family Medicine

## 2016-12-07 DIAGNOSIS — R1012 Left upper quadrant pain: Secondary | ICD-10-CM

## 2016-12-20 DIAGNOSIS — E8881 Metabolic syndrome: Secondary | ICD-10-CM | POA: Insufficient documentation

## 2016-12-20 DIAGNOSIS — E559 Vitamin D deficiency, unspecified: Secondary | ICD-10-CM | POA: Insufficient documentation

## 2017-01-14 ENCOUNTER — Encounter: Payer: Self-pay | Admitting: Internal Medicine

## 2017-01-14 ENCOUNTER — Ambulatory Visit (INDEPENDENT_AMBULATORY_CARE_PROVIDER_SITE_OTHER): Payer: BLUE CROSS/BLUE SHIELD | Admitting: Internal Medicine

## 2017-01-14 VITALS — BP 100/78 | HR 84 | Ht 61.54 in | Wt 260.5 lb

## 2017-01-14 DIAGNOSIS — R12 Heartburn: Secondary | ICD-10-CM

## 2017-01-14 DIAGNOSIS — R197 Diarrhea, unspecified: Secondary | ICD-10-CM | POA: Diagnosis not present

## 2017-01-14 DIAGNOSIS — R490 Dysphonia: Secondary | ICD-10-CM

## 2017-01-14 DIAGNOSIS — J029 Acute pharyngitis, unspecified: Secondary | ICD-10-CM

## 2017-01-14 DIAGNOSIS — Z8379 Family history of other diseases of the digestive system: Secondary | ICD-10-CM

## 2017-01-14 DIAGNOSIS — R1012 Left upper quadrant pain: Secondary | ICD-10-CM

## 2017-01-14 DIAGNOSIS — R1011 Right upper quadrant pain: Secondary | ICD-10-CM

## 2017-01-14 MED ORDER — DICYCLOMINE HCL 20 MG PO TABS: 20.0000 mg | ORAL_TABLET | Freq: Four times a day (QID) | ORAL | 0 refills | Status: DC | PRN

## 2017-01-14 MED ORDER — ESOMEPRAZOLE MAGNESIUM 40 MG PO CPDR: 40.0000 mg | DELAYED_RELEASE_CAPSULE | Freq: Two times a day (BID) | ORAL | 3 refills | Status: DC

## 2017-01-14 NOTE — Patient Instructions (Signed)
You have been scheduled for an endoscopy and colonoscopy. Please follow the written instructions given to you at your visit today. Please pick up your prep supplies at the pharmacy. If you use inhalers (even only as needed), please bring them with you on the day of your procedure. Your physician has requested that you go to www.startemmi.com and enter the access code given to you at your visit today. This web site gives a general overview about your procedure. However, you should still follow specific instructions given to you by our office regarding your preparation for the procedure.    We have sent the following medications to your pharmacy for you to pick up at your convenience: Dicyclomine , Nexium   I appreciate the opportunity to care for you. Silvano Rusk, MD, [facility]
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
## 2017-02-21 ENCOUNTER — Encounter: Payer: Self-pay | Admitting: Internal Medicine

## 2017-02-26 ENCOUNTER — Encounter: Payer: Self-pay | Admitting: Family Medicine

## 2017-03-04 MED ORDER — ESCITALOPRAM OXALATE 10 MG PO TABS: 10.0000 mg | ORAL_TABLET | Freq: Every day | ORAL | 1 refills | Status: DC

## 2017-03-07 ENCOUNTER — Encounter: Payer: BLUE CROSS/BLUE SHIELD | Admitting: Internal Medicine

## 2017-03-18 ENCOUNTER — Encounter: Payer: Self-pay | Admitting: Family Medicine

## 2017-03-22 ENCOUNTER — Other Ambulatory Visit: Payer: Self-pay

## 2017-03-22 ENCOUNTER — Encounter: Payer: Self-pay | Admitting: Family Medicine

## 2017-03-22 MED ORDER — LEVOTHYROXINE SODIUM 137 MCG PO TABS: 137.0000 ug | ORAL_TABLET | Freq: Every day | ORAL | 1 refills | Status: DC

## 2017-04-17 ENCOUNTER — Ambulatory Visit (AMBULATORY_SURGERY_CENTER): Payer: BLUE CROSS/BLUE SHIELD | Admitting: Internal Medicine

## 2017-04-17 ENCOUNTER — Encounter: Payer: Self-pay | Admitting: Internal Medicine

## 2017-04-17 VITALS — BP 105/82 | HR 67 | Temp 98.0°F | Resp 13 | Ht 61.0 in | Wt 260.0 lb

## 2017-04-17 DIAGNOSIS — K317 Polyp of stomach and duodenum: Secondary | ICD-10-CM

## 2017-04-17 DIAGNOSIS — R197 Diarrhea, unspecified: Secondary | ICD-10-CM | POA: Diagnosis present

## 2017-04-17 DIAGNOSIS — K921 Melena: Secondary | ICD-10-CM

## 2017-04-17 DIAGNOSIS — R1011 Right upper quadrant pain: Secondary | ICD-10-CM

## 2017-04-17 DIAGNOSIS — R1012 Left upper quadrant pain: Secondary | ICD-10-CM

## 2017-04-17 MED ORDER — SODIUM CHLORIDE 0.9 % IV SOLN: 500.0000 mL | INTRAVENOUS | Status: DC

## 2017-04-17 NOTE — Progress Notes (Signed)
Called to room to assist during endoscopic procedure.  Patient ID and intended procedure confirmed with present staff. Received instructions for my participation in the procedure from the performing physician.  

## 2017-04-17 NOTE — Op Note (Signed)
[place] Patient Name: Desiree Jackson Procedure Date: 04/17/2017 1:56 PM MRN: [id number] Endoscopist: Gatha Mayer , MD Age: 36 Referring MD:  Date of Birth: 09-04-1980 Gender: Female Account #: [id number] Procedure:                Colonoscopy Indications:              Clinically significant diarrhea of unexplained                            origin, Rectal bleeding Medicines:                Propofol per Anesthesia, Monitored Anesthesia Care Procedure:                Pre-Anesthesia Assessment:                           - Prior to the procedure, a History and Physical                            was performed, and patient medications and                            allergies were reviewed. The patient's tolerance of                            previous anesthesia was also reviewed. The risks                            and benefits of the procedure and the sedation                            options and risks were discussed with the patient.                            All questions were answered, and informed consent                            was obtained. Prior Anticoagulants: The patient has                            taken no previous anticoagulant or antiplatelet                            agents. ASA Grade Assessment: III - A patient with                            severe systemic disease. After reviewing the risks                            and benefits, the patient was deemed in                            satisfactory condition to undergo the procedure.  After obtaining informed consent, the colonoscope                            was passed under direct vision. Throughout the                            procedure, the patient's blood pressure, pulse, and                            oxygen saturations were monitored continuously. The                            Colonoscope was introduced through the anus and                            advanced  to the the terminal ileum, with                            identification of the appendiceal orifice and IC                            valve. The colonoscopy was performed without                            difficulty. The patient tolerated the procedure                            well. The quality of the bowel preparation was                            good. The bowel preparation used was Miralax. The                            terminal ileum, ileocecal valve, appendiceal                            orifice, and rectum were photographed. Scope In: 2:12:22 PM Scope Out: 2:21:20 PM Scope Withdrawal Time: 0 hours 7 minutes 1 second  Total Procedure Duration: 0 hours 8 minutes 58 seconds  Findings:                 The perianal and digital rectal examinations were                            normal.                           External and internal hemorrhoids were found during                            retroflexion.                           The terminal ileum appeared normal.  The colon (entire examined portion) appeared                            normal. Biopsies for histology were taken with a                            cold forceps from the right colon and left colon                            for evaluation of microscopic colitis. Verification                            of patient identification for the specimen was                            done. Estimated blood loss was minimal. Complications:            No immediate complications. Estimated Blood Loss:     Estimated blood loss was minimal. Impression:               - External and internal hemorrhoids.                           - The examined portion of the ileum was normal.                           - The entire examined colon is normal. Biopsied. Recommendation:           - Patient has a contact number available for                            emergencies. The signs and symptoms of potential                             delayed complications were discussed with the                            patient. Return to normal activities tomorrow.                            Written discharge instructions were provided to the                            patient.                           - Resume previous diet.                           - Continue present medications.                           - Repeat colonoscopy is recommended. The                            colonoscopy date will be determined after  pathology                            results from today's exam become available for                            review. Gatha Mayer, MD 04/17/2017 2:30:13 PM This report has been signed electronically.

## 2017-04-17 NOTE — Op Note (Signed)
[place] Patient Name: Desiree Jackson Procedure Date: 04/17/2017 1:56 PM MRN: [id number] Endoscopist: Gatha Mayer , MD Age: 36 Referring MD:  Date of Birth: 1981-03-07 Gender: Female Account #: [id number] Procedure:                Upper GI endoscopy Indications:              Upper abdominal pain Medicines:                Propofol per Anesthesia, Monitored Anesthesia Care Procedure:                Pre-Anesthesia Assessment:                           - Prior to the procedure, a History and Physical                            was performed, and patient medications and                            allergies were reviewed. The patient's tolerance of                            previous anesthesia was also reviewed. The risks                            and benefits of the procedure and the sedation                            options and risks were discussed with the patient.                            All questions were answered, and informed consent                            was obtained. Prior Anticoagulants: The patient has                            taken no previous anticoagulant or antiplatelet                            agents. ASA Grade Assessment: III - A patient with                            severe systemic disease. After reviewing the risks                            and benefits, the patient was deemed in                            satisfactory condition to undergo the procedure.                           After obtaining informed consent, the endoscope was  passed under direct vision. Throughout the                            procedure, the patient's blood pressure, pulse, and                            oxygen saturations were monitored continuously. The                            Endoscope was introduced through the mouth, and                            advanced to the second part of duodenum. The upper                            GI  endoscopy was accomplished without difficulty.                            The patient tolerated the procedure well. Scope In: Scope Out: Findings:                 Multiple small semi-sessile polyps with no stigmata                            of recent bleeding were found in the gastric fundus                            and in the gastric body. Biopsies were taken with a                            cold forceps for histology. Verification of patient                            identification for the specimen was done. Estimated                            blood loss was minimal.                           The exam was otherwise without abnormality.                           The cardia and gastric fundus were normal on                            retroflexion. Complications:            No immediate complications. Estimated Blood Loss:     Estimated blood loss was minimal. Impression:               - Multiple gastric polyps. Biopsied. Look like                            fundic gland polyps.                           -  The examination was otherwise normal. Recommendation:           - Patient has a contact number available for                            emergencies. The signs and symptoms of potential                            delayed complications were discussed with the                            patient. Return to normal activities tomorrow.                            Written discharge instructions were provided to the                            patient.                           - Resume previous diet.                           - Continue present medications.                           - Await pathology results.                           - See the other procedure note for documentation of                            additional recommendations. Colonoscopy next Gatha Mayer, MD 04/17/2017 2:26:23 PM This report has been signed electronically.

## 2017-04-17 NOTE — Patient Instructions (Addendum)
Discharge instructions given. Biopsies taken. Handout on hemorrhoids. Resume previous medications. YOU HAD AN ENDOSCOPIC PROCEDURE TODAY AT [facility]:   Refer to the procedure report that was given to you for any specific questions about what was found during the examination.  If the procedure report does not answer your questions, please call your gastroenterologist to clarify.  If you requested that your care partner not be given the details of your procedure findings, then the procedure report has been included in a sealed envelope for you to review at your convenience later.  YOU SHOULD EXPECT: Some feelings of bloating in the abdomen. Passage of more gas than usual.  Walking can help get rid of the air that was put into your GI tract during the procedure and reduce the bloating. If you had a lower endoscopy (such as a colonoscopy or flexible sigmoidoscopy) you may notice spotting of blood in your stool or on the toilet paper. If you underwent a bowel prep for your procedure, you may not have a normal bowel movement for a few days.  Please Note:  You might notice some irritation and congestion in your nose or some drainage.  This is from the oxygen used during your procedure.  There is no need for concern and it should clear up in a day or so.  SYMPTOMS TO REPORT IMMEDIATELY:   Following lower endoscopy (colonoscopy or flexible sigmoidoscopy):  Excessive amounts of blood in the stool  Significant tenderness or worsening of abdominal pains  Swelling of the abdomen that is new, acute  Fever of 100F or higher   Following upper endoscopy (EGD)  Vomiting of blood or coffee ground material  New chest pain or pain under the shoulder blades  Painful or persistently difficult swallowing  New shortness of breath  Fever of 100F or higher  Black, tarry-looking stools  For urgent or emergent issues, a gastroenterologist can be reached at any hour by calling [phone]  [phone].   DIET:  We do recommend a small meal at first, but then you may proceed to your regular diet.  Drink plenty of fluids but you should avoid alcoholic beverages for 24 hours.  ACTIVITY:  You should plan to take it easy for the rest of today and you should NOT DRIVE or use heavy machinery until tomorrow (because of the sedation medicines used during the test).    FOLLOW UP: Our staff will call the number listed on your records the next business day following your procedure to check on you and address any questions or concerns that you may have regarding the information given to you following your procedure. If we do not reach you, we will leave a message.  However, if you are feeling well and you are not experiencing any problems, there is no need to return our call.  We will assume that you have returned to your regular daily activities without incident.  If any biopsies were taken you will be contacted by phone or by letter within the next 1-3 weeks.  Please call us at [phone] if you have not heard about the biopsies in 3 weeks.    SIGNATURES/CONFIDENTIALITY: You and/or your care partner have signed paperwork which will be entered into your electronic medical record.  These signatures attest to the fact that that the information above on your After Visit Summary has been reviewed and is understood.  Full responsibility of the confidentiality of this discharge information lies with you and/or your care-partner.

## 2017-04-17 NOTE — Progress Notes (Signed)
A/ox3 pleased with MAC, report to [facility]

## 2017-04-18 ENCOUNTER — Telehealth: Payer: Self-pay | Admitting: *Deleted

## 2017-04-18 NOTE — Telephone Encounter (Signed)
  Follow up Call-  Call back number 04/17/2017  Post procedure Call Back phone  # [phone]  Permission to leave phone message Yes  Some recent data might be hidden    [facility]

## 2017-04-18 NOTE — Telephone Encounter (Signed)
  Follow up Call-  Call back number 04/17/2017  Post procedure Call Back phone  # [phone]  Permission to leave phone message Yes  Some recent data might be hidden    [facility]

## 2017-04-26 NOTE — Progress Notes (Signed)
Call from office  Gastric polyps benign and no f/u needed Colon bxs normal - no colitis  She is on dicyclomine - is that helping - if not let me know and can add something She should reduce regular soda consumption if she hasn't and hopefully she saw the bariatric dietitian that Dr. Valetta Close referred her to - she should do that for better eating habits and she can ask them about   She needs a next available f/u  No recall and no letter from [facility]

## 2017-04-30 ENCOUNTER — Ambulatory Visit (INDEPENDENT_AMBULATORY_CARE_PROVIDER_SITE_OTHER): Payer: BLUE CROSS/BLUE SHIELD | Admitting: Family Medicine

## 2017-04-30 ENCOUNTER — Encounter: Payer: Self-pay | Admitting: Family Medicine

## 2017-04-30 VITALS — BP 122/70 | HR 92 | Ht 61.0 in | Wt 249.0 lb

## 2017-04-30 DIAGNOSIS — R002 Palpitations: Secondary | ICD-10-CM | POA: Diagnosis not present

## 2017-04-30 DIAGNOSIS — E039 Hypothyroidism, unspecified: Secondary | ICD-10-CM

## 2017-04-30 DIAGNOSIS — F439 Reaction to severe stress, unspecified: Secondary | ICD-10-CM

## 2017-04-30 DIAGNOSIS — R7301 Impaired fasting glucose: Secondary | ICD-10-CM

## 2017-04-30 LAB — POCT GLYCOSYLATED HEMOGLOBIN (HGB A1C): Hemoglobin A1C: 5.4

## 2017-04-30 MED ORDER — METFORMIN HCL ER 500 MG PO TB24: 500.0000 mg | ORAL_TABLET | Freq: Every day | ORAL | 1 refills | Status: DC
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
## 2017-05-01 ENCOUNTER — Encounter: Payer: Self-pay | Admitting: Family Medicine

## 2017-05-02 LAB — BASIC METABOLIC PANEL WITH GFR
BUN: 17 mg/dL (ref 7–25)
CALCIUM: 9.8 mg/dL (ref 8.6–10.2)
CHLORIDE: 100 mmol/L (ref 98–110)
CO2: 29 mmol/L (ref 20–32)
Creat: 0.76 mg/dL (ref 0.50–1.10)
GFR, EST AFRICAN AMERICAN: 116 mL/min/{1.73_m2} (ref >=60)
GFR, Est Non African American: 100 mL/min/{1.73_m2} (ref >=60)
Glucose, Bld: 82 mg/dL (ref 65–99)
Potassium: 4.7 mmol/L (ref 3.5–5.3)
Sodium: 139 mmol/L (ref 135–146)

## 2017-05-02 LAB — CBC WITH DIFFERENTIAL/PLATELET
BASOS ABS: 52 {cells}/uL (ref 0–200)
Basophils Relative: 0.6 %
EOS ABS: 155 {cells}/uL (ref 15–500)
Eosinophils Relative: 1.8 %
HCT: 43.7 % (ref 35.0–45.0)
Hemoglobin: 14.6 g/dL (ref 11.7–15.5)
Lymphs Abs: 2718 cells/uL (ref 850–3900)
MCH: 28.1 pg (ref 27.0–33.0)
MCHC: 33.4 g/dL (ref 32.0–36.0)
MCV: 84.2 fL (ref 80.0–100.0)
MPV: 10 fL (ref 7.5–12.5)
Monocytes Relative: 6.5 %
NEUTROS PCT: 59.5 %
Neutro Abs: 5117 cells/uL (ref 1500–7800)
PLATELETS: 374 10*3/uL (ref 140–400)
RBC: 5.19 10*6/uL — ABNORMAL HIGH (ref 3.80–5.10)
RDW: 13.3 % (ref 11.0–15.0)
TOTAL LYMPHOCYTE: 31.6 %
WBC mixed population: 559 cells/uL (ref 200–950)
WBC: 8.6 10*3/uL (ref 3.8–10.8)

## 2017-05-02 LAB — TSH: TSH: 0.07 m[IU]/L — AB

## 2017-05-14 ENCOUNTER — Encounter: Payer: Self-pay | Admitting: Family Medicine

## 2017-05-20 ENCOUNTER — Telehealth: Payer: Self-pay

## 2017-05-20 NOTE — Telephone Encounter (Signed)
Pt has been having dizzy spells , going on since Thursday, worse in the evening.  Has tried motion sickness medication, but not sure if it helped.  No change in BP, pulse the same.  Advised to make an appoinment to be checked out.

## 2017-05-21 ENCOUNTER — Encounter: Payer: Self-pay | Admitting: Family Medicine

## 2017-05-21 ENCOUNTER — Ambulatory Visit (INDEPENDENT_AMBULATORY_CARE_PROVIDER_SITE_OTHER): Payer: BLUE CROSS/BLUE SHIELD | Admitting: Family Medicine

## 2017-05-21 VITALS — BP 113/68 | HR 87 | Wt 251.0 lb

## 2017-05-21 DIAGNOSIS — H8113 Benign paroxysmal vertigo, bilateral: Secondary | ICD-10-CM | POA: Diagnosis not present

## 2017-05-21 MED ORDER — ONDANSETRON HCL 4 MG PO TABS
4.0000 mg | ORAL_TABLET | Freq: Three times a day (TID) | ORAL | 0 refills | Status: DC | PRN
Start: 2017-05-21 — End: 2017-09-20
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
## 2017-05-21 NOTE — Patient Instructions (Addendum)
Can get meclizine which is over the counter.  Call if not improving after the next 2 days with the exercises.

## 2017-05-27 ENCOUNTER — Encounter: Payer: Self-pay | Admitting: Family Medicine

## 2017-05-27 MED ORDER — PREDNISONE 20 MG PO TABS: 40.0000 mg | ORAL_TABLET | Freq: Every day | ORAL | 0 refills | Status: DC

## 2017-05-28 ENCOUNTER — Ambulatory Visit (INDEPENDENT_AMBULATORY_CARE_PROVIDER_SITE_OTHER): Payer: BLUE CROSS/BLUE SHIELD

## 2017-05-28 ENCOUNTER — Ambulatory Visit (HOSPITAL_COMMUNITY): Payer: BLUE CROSS/BLUE SHIELD | Attending: Cardiology

## 2017-05-28 ENCOUNTER — Other Ambulatory Visit: Payer: Self-pay

## 2017-05-28 DIAGNOSIS — R002 Palpitations: Secondary | ICD-10-CM

## 2017-05-28 DIAGNOSIS — E039 Hypothyroidism, unspecified: Secondary | ICD-10-CM | POA: Diagnosis not present

## 2017-05-28 DIAGNOSIS — F419 Anxiety disorder, unspecified: Secondary | ICD-10-CM | POA: Insufficient documentation

## 2017-06-04 ENCOUNTER — Encounter: Payer: Self-pay | Admitting: Family Medicine

## 2017-06-04 DIAGNOSIS — H81399 Other peripheral vertigo, unspecified ear: Secondary | ICD-10-CM

## 2017-06-04 DIAGNOSIS — R519 Headache, unspecified: Secondary | ICD-10-CM

## 2017-06-04 DIAGNOSIS — R51 Headache: Principal | ICD-10-CM

## 2017-06-13 ENCOUNTER — Other Ambulatory Visit: Payer: Self-pay | Admitting: Medical

## 2017-06-15 ENCOUNTER — Ambulatory Visit (HOSPITAL_BASED_OUTPATIENT_CLINIC_OR_DEPARTMENT_OTHER): Payer: BLUE CROSS/BLUE SHIELD

## 2017-07-01 ENCOUNTER — Ambulatory Visit: Payer: BLUE CROSS/BLUE SHIELD | Admitting: Internal Medicine

## 2017-08-01 ENCOUNTER — Other Ambulatory Visit: Payer: Self-pay | Admitting: Obstetrics & Gynecology

## 2017-08-01 DIAGNOSIS — L900 Lichen sclerosus et atrophicus: Secondary | ICD-10-CM

## 2017-09-20 ENCOUNTER — Ambulatory Visit (INDEPENDENT_AMBULATORY_CARE_PROVIDER_SITE_OTHER): Payer: BLUE CROSS/BLUE SHIELD | Admitting: Family Medicine

## 2017-09-20 ENCOUNTER — Encounter: Payer: Self-pay | Admitting: Family Medicine

## 2017-09-20 VITALS — BP 135/82 | HR 80 | Temp 99.3°F | Ht 61.0 in | Wt 252.0 lb

## 2017-09-20 DIAGNOSIS — J029 Acute pharyngitis, unspecified: Secondary | ICD-10-CM

## 2017-09-20 DIAGNOSIS — K219 Gastro-esophageal reflux disease without esophagitis: Secondary | ICD-10-CM | POA: Insufficient documentation

## 2017-09-20 DIAGNOSIS — J0100 Acute maxillary sinusitis, unspecified: Secondary | ICD-10-CM

## 2017-09-20 DIAGNOSIS — E042 Nontoxic multinodular goiter: Secondary | ICD-10-CM | POA: Insufficient documentation

## 2017-09-20 LAB — POCT RAPID STREP A (OFFICE): RAPID STREP A SCREEN: NEGATIVE

## 2017-09-20 MED ORDER — AZITHROMYCIN 250 MG PO TABS: ORAL_TABLET | ORAL | 0 refills | Status: AC

## 2017-09-20 NOTE — Progress Notes (Signed)
   Subjective:    Patient ID: Desiree Jackson, female    DOB: 05-Feb-1981, 37 y.o.   MRN: [id number]  HPI 37 year old female comes in with about 10 days of nasal congestion, sore throat, swollen glands and discomfort in her neck.  She is been using ibuprofen, lozenges, salt water gargles etc.  She really just has not gotten any significant relief.  She denies any fevers chills or sweats.  She would like to be checked for strep throat.   Review of Systems     Objective:   Physical Exam  Constitutional: She is oriented to person, place, and time. She appears well-developed and well-nourished.  HENT:  Head: Normocephalic and atraumatic.  Right Ear: External ear normal.  Left Ear: External ear normal.  Nose: Nose normal.  Mouth/Throat: Oropharynx is clear and moist.  TMs and canals are clear.  Erythema and tenderness over the maxillary sinuses bilaterally.  Eyes: Conjunctivae and EOM are normal. Pupils are equal, round, and reactive to light.  Neck: Neck supple. No thyromegaly present.  Cardiovascular: Normal rate, regular rhythm and normal heart sounds.  Pulmonary/Chest: Effort normal and breath sounds normal. She has no wheezes.  Lymphadenopathy:    She has no cervical adenopathy.  Neurological: She is alert and oriented to person, place, and time.  Skin: Skin is warm and dry.  Psychiatric: She has a normal mood and affect.        Assessment & Plan:  Acute sinusitis with maxillary tenderness and low-grade fever today.  We did do a strep test which was negative.  We will go ahead and treat with azithromycin because of penicillin allergy.  Call if not significantly better in 1 week.  Continue symptom medic care recommend addition of nasal saline irrigation in addition to running a humidifier at night.

## 2017-09-20 NOTE — Patient Instructions (Addendum)
Sinus Rinse What is a sinus rinse? A sinus rinse is a home treatment. It rinses your sinuses with a mixture of salt and water (saline solution). Sinuses are air-filled spaces in your skull behind the bones of your face and forehead. They open into your nasal cavity. To do a sinus rinse, you will need:  Saline solution.  Neti pot or spray bottle. This releases the saline solution into your nose and through your sinuses. You can buy neti pots and spray bottles at: ? Your local pharmacy. ? A health food store. ? Online.  When should I do a sinus rinse? A sinus rinse can help to clear your nasal cavity. It can clear:  Mucus.  Dirt.  Dust.  Pollen.  You may do a sinus rinse when you have:  A cold.  A virus.  Allergies.  A sinus infection.  A stuffy nose.  If you are considering a sinus rinse:  Ask your child's doctor before doing a sinus rinse on your child.  Do not do a sinus rinse if you have had: ? Ear or nasal surgery. ? An ear infection. ? Blocked ears.  How do I do a sinus rinse?  Wash your hands.  Disinfect your device using the directions that came with the device.  Dry your device.  Use the solution that comes with your device or one that is sold separately in stores. Follow the mixing directions on the package.  Fill your device with the amount of saline solution as stated in the device instructions.  Stand over a sink and tilt your head sideways over the sink.  Place the spout of the device in your upper nostril (the one closer to the ceiling).  Gently pour or squeeze the saline solution into the nasal cavity. The liquid should drain to the lower nostril if you are not too congested.  Gently blow your nose. Blowing too hard may cause ear pain.  Repeat in the other nostril.  Clean and rinse your device with clean water.  Air-dry your device. Are there risks of a sinus rinse? Sinus rinse is normally very safe and helpful. However, there are a  few risks, which include:  A burning feeling in the sinuses. This may happen if you do not make the saline solution as instructed. Make sure to follow all directions when making the saline solution.  Infection from unclean water. This is rare, but possible.  Nasal irritation.  This information is not intended to replace advice given to you by your health care provider. Make sure you discuss any questions you have with your health care provider. Document Released: 03/10/2014 Document Revised: 07/10/2016 Document Reviewed: 12/29/2013 Elsevier Interactive Patient Education  2017 Elsevier Inc.  

## 2017-10-03 ENCOUNTER — Other Ambulatory Visit: Payer: Self-pay | Admitting: Family Medicine

## 2017-10-03 MED ORDER — LEVOTHYROXINE SODIUM 137 MCG PO TABS: 137.0000 ug | ORAL_TABLET | Freq: Every day | ORAL | 0 refills | Status: DC

## 2017-10-16 ENCOUNTER — Encounter: Payer: Self-pay | Admitting: Family Medicine

## 2017-10-18 ENCOUNTER — Encounter: Payer: Self-pay | Admitting: Family Medicine

## 2017-10-18 ENCOUNTER — Ambulatory Visit (INDEPENDENT_AMBULATORY_CARE_PROVIDER_SITE_OTHER): Payer: BLUE CROSS/BLUE SHIELD

## 2017-10-18 ENCOUNTER — Ambulatory Visit: Payer: BLUE CROSS/BLUE SHIELD | Admitting: Family Medicine

## 2017-10-18 VITALS — BP 128/72 | HR 81 | Ht 61.0 in | Wt 254.0 lb

## 2017-10-18 DIAGNOSIS — L301 Dyshidrosis [pompholyx]: Secondary | ICD-10-CM | POA: Diagnosis not present

## 2017-10-18 DIAGNOSIS — M79645 Pain in left finger(s): Secondary | ICD-10-CM

## 2017-10-18 DIAGNOSIS — E063 Autoimmune thyroiditis: Secondary | ICD-10-CM | POA: Diagnosis not present

## 2017-10-18 DIAGNOSIS — E8881 Metabolic syndrome: Secondary | ICD-10-CM | POA: Diagnosis not present

## 2017-10-18 DIAGNOSIS — E892 Postprocedural hypoparathyroidism: Secondary | ICD-10-CM | POA: Diagnosis not present

## 2017-10-18 LAB — TSH: TSH: 0.26 mIU/L — ABNORMAL LOW

## 2017-10-18 LAB — POCT GLYCOSYLATED HEMOGLOBIN (HGB A1C): HEMOGLOBIN A1C: 5.3

## 2017-10-18 MED ORDER — LEVOTHYROXINE SODIUM 137 MCG PO TABS: 137.0000 ug | ORAL_TABLET | Freq: Every day | ORAL | 0 refills | Status: DC

## 2017-10-18 MED ORDER — METFORMIN HCL ER 500 MG PO TB24: 500.0000 mg | ORAL_TABLET | Freq: Every day | ORAL | 1 refills | Status: DC

## 2017-10-18 MED ORDER — CLOBETASOL PROPIONATE 0.05 % EX OINT: 1.0000 "application " | TOPICAL_OINTMENT | Freq: Two times a day (BID) | CUTANEOUS | 1 refills | Status: DC
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
## 2017-10-20 ENCOUNTER — Other Ambulatory Visit: Payer: Self-pay | Admitting: Family Medicine

## 2017-10-20 MED ORDER — LEVOTHYROXINE SODIUM 125 MCG PO TABS: 125.0000 ug | ORAL_TABLET | Freq: Every day | ORAL | 1 refills | Status: DC

## 2017-10-21 ENCOUNTER — Other Ambulatory Visit: Payer: Self-pay

## 2017-10-21 DIAGNOSIS — E038 Other specified hypothyroidism: Secondary | ICD-10-CM

## 2017-12-16 ENCOUNTER — Encounter: Payer: Self-pay | Admitting: Family Medicine

## 2017-12-18 DIAGNOSIS — E038 Other specified hypothyroidism: Secondary | ICD-10-CM | POA: Diagnosis not present

## 2017-12-19 LAB — TSH: TSH: 0.5 m[IU]/L

## 2017-12-19 MED ORDER — LEVOTHYROXINE SODIUM 112 MCG PO TABS: 112.0000 ug | ORAL_TABLET | Freq: Every day | ORAL | 0 refills | Status: DC

## 2017-12-30 ENCOUNTER — Encounter: Payer: Self-pay | Admitting: Family Medicine

## 2017-12-31 MED ORDER — LEVOTHYROXINE SODIUM 125 MCG PO TABS: 125.0000 ug | ORAL_TABLET | ORAL | 0 refills | Status: DC

## 2018-02-11 DIAGNOSIS — H40053 Ocular hypertension, bilateral: Secondary | ICD-10-CM | POA: Diagnosis not present

## 2018-02-11 DIAGNOSIS — Z148 Genetic carrier of other disease: Secondary | ICD-10-CM | POA: Diagnosis not present

## 2018-02-11 DIAGNOSIS — H3552 Pigmentary retinal dystrophy: Secondary | ICD-10-CM | POA: Diagnosis not present

## 2018-02-19 ENCOUNTER — Encounter: Payer: Self-pay | Admitting: Family Medicine

## 2018-02-19 DIAGNOSIS — E063 Autoimmune thyroiditis: Secondary | ICD-10-CM

## 2018-02-19 DIAGNOSIS — E038 Other specified hypothyroidism: Secondary | ICD-10-CM

## 2018-02-21 DIAGNOSIS — E063 Autoimmune thyroiditis: Secondary | ICD-10-CM | POA: Diagnosis not present

## 2018-02-21 DIAGNOSIS — E038 Other specified hypothyroidism: Secondary | ICD-10-CM | POA: Diagnosis not present

## 2018-02-21 LAB — TSH: TSH: 1.68 m[IU]/L

## 2018-02-26 ENCOUNTER — Other Ambulatory Visit: Payer: Self-pay

## 2018-02-26 MED ORDER — ESOMEPRAZOLE MAGNESIUM 40 MG PO CPDR: 40.0000 mg | DELAYED_RELEASE_CAPSULE | Freq: Two times a day (BID) | ORAL | 1 refills | Status: DC

## 2018-03-13 ENCOUNTER — Ambulatory Visit (INDEPENDENT_AMBULATORY_CARE_PROVIDER_SITE_OTHER): Payer: BLUE CROSS/BLUE SHIELD | Admitting: Obstetrics & Gynecology

## 2018-03-13 ENCOUNTER — Encounter: Payer: Self-pay | Admitting: Obstetrics & Gynecology

## 2018-03-13 VITALS — BP 132/93 | HR 83 | Ht 62.0 in | Wt 249.0 lb

## 2018-03-13 DIAGNOSIS — L739 Follicular disorder, unspecified: Secondary | ICD-10-CM

## 2018-03-13 DIAGNOSIS — Z803 Family history of malignant neoplasm of breast: Secondary | ICD-10-CM | POA: Diagnosis not present

## 2018-03-13 DIAGNOSIS — L301 Dyshidrosis [pompholyx]: Secondary | ICD-10-CM

## 2018-03-13 DIAGNOSIS — Z01419 Encounter for gynecological examination (general) (routine) without abnormal findings: Secondary | ICD-10-CM

## 2018-03-13 MED ORDER — CLOBETASOL PROPIONATE 0.05 % EX OINT: 1.0000 "application " | TOPICAL_OINTMENT | Freq: Two times a day (BID) | CUTANEOUS | 6 refills | Status: DC

## 2018-03-13 NOTE — Progress Notes (Signed)
Pt c/o lichen sclerosus & a "lump on vagina"

## 2018-03-13 NOTE — Progress Notes (Signed)
Subjective:    Desiree Jackson is a 37 y.o. married P2 (26 yo daughter and 33 yo son) female who presents for an annual exam. The patient has no complaints today. She got a vulvar bump in June while hiking. The bump resolved but returned again 2 weeks ago. It has gotten smaller, but is still present.  The patient is sexually active. GYN screening history: last pap: was normal. The patient wears seatbelts: yes. The patient participates in regular exercise: yes. Has the patient ever been transfused or tattooed?: yes. The patient reports that there is not domestic violence in her life.   Menstrual History: OB History    Gravida  5   Para  2   Term  2   Preterm      AB  3   Living  2     SAB  3   TAB      Ectopic      Multiple      Live Births  2           Menarche age: 5 Patient's last menstrual period was 03/06/2013.    The following portions of the patient's history were reviewed and updated as appropriate: allergies, current medications, past family history, past medical history, past social history, past surgical history and problem list.  Review of Systems Pertinent items are noted in HPI.   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   Objective:    BP (!) 132/93   Pulse 83   Ht 5\' 2"  (1.575 m)   Wt 249 lb (112.9 kg)   LMP 03/06/2013   BMI 45.54 kg/m   General Appearance:    Alert, cooperative, no distress, appears stated age  Head:    Normocephalic, without obvious abnormality, atraumatic  Eyes:    PERRL, conjunctiva/corneas clear, EOM's intact, fundi    benign, both eyes  Ears:    Normal TM's and external ear canals, both ears  Nose:   Nares normal, septum midline, mucosa normal, no drainage    or sinus tenderness  Throat:   Lips, mucosa, and tongue normal; teeth and gums normal  Neck:   Supple,  symmetrical, trachea midline, no adenopathy;    thyroid:  no enlargement/tenderness/nodules; no carotid   bruit or JVD  Back:     Symmetric, no curvature, ROM normal, no CVA tenderness  Lungs:     Clear to auscultation bilaterally, respirations unlabored  Chest Wall:    No tenderness or deformity   Heart:    Regular rate and rhythm, S1 and S2 normal, no murmur, rub   or gallop  Breast Exam:    No tenderness, masses, or nipple abnormality  Abdomen:     Soft, non-tender, bowel sounds active all four quadrants,    no masses, no organomegaly, obese, benign  Genitalia:    Normal female without lesion, discharge or tenderness, small folliculitis, normal speculum exam, no prolapse, no palpable masses, no tenderness     Extremities:   Extremities normal, atraumatic, no cyanosis or edema  Pulses:   2+ and symmetric all extremities  Skin:   Skin color, texture, turgor normal, no rashes or lesions  Lymph nodes:   Cervical, supraclavicular, and axillary nodes normal  Neurologic:   CNII-XII intact, normal strength, sensation and reflexes    throughout  .  Assessment:    Healthy female exam.   + FH of breast cancer   Plan:     Discussed healthy lifestyle modifications.   Refill clobetasol for biopsy proven lichen sclerosis [facility]

## 2018-03-24 ENCOUNTER — Encounter: Payer: Self-pay | Admitting: *Deleted

## 2018-03-30 ENCOUNTER — Other Ambulatory Visit: Payer: Self-pay | Admitting: Family Medicine

## 2018-03-31 ENCOUNTER — Other Ambulatory Visit: Payer: Self-pay | Admitting: Family Medicine

## 2018-04-17 ENCOUNTER — Ambulatory Visit: Payer: BLUE CROSS/BLUE SHIELD | Admitting: Family Medicine

## 2018-04-27 ENCOUNTER — Other Ambulatory Visit: Payer: Self-pay | Admitting: Family Medicine

## 2018-05-06 ENCOUNTER — Telehealth: Payer: Self-pay

## 2018-05-06 ENCOUNTER — Ambulatory Visit: Payer: BLUE CROSS/BLUE SHIELD | Admitting: Family Medicine

## 2018-05-06 NOTE — Telephone Encounter (Signed)
Pt's husband called at 11:15 to cancel day-of appt scheduled for 3:20.  Per husband, they did not have transportation to visit.   Appt rescheduled for 05-19-18 at 7:30.  FYI to PCP

## 2018-05-07 NOTE — Telephone Encounter (Signed)
Ok, thank you

## 2018-05-19 ENCOUNTER — Ambulatory Visit: Payer: BLUE CROSS/BLUE SHIELD | Admitting: Family Medicine

## 2018-05-19 NOTE — Progress Notes (Deleted)
Subjective:    CC:   HPI:  Hypothyroidism - Taking medication regularly in the AM away from food and vitamins, etc. No recent change to skin, hair, or energy levels.   Past medical history, Surgical history, Family history not pertinant except as noted below, Social history, Allergies, and medications have been entered into the medical record, reviewed, and corrections made.   Review of Systems: No fevers, chills, night sweats, weight loss, chest pain, or shortness of breath.   Objective:    General: Well Developed, well nourished, and in no acute distress.  Neuro: Alert and oriented x3, extra-ocular muscles intact, sensation grossly intact.  HEENT: Normocephalic, atraumatic  Skin: Warm and dry, no rashes. Cardiac: Regular rate and rhythm, no murmurs rubs or gallops, no lower extremity edema.  Respiratory: Clear to auscultation bilaterally. Not using accessory muscles, speaking in full sentences.   Impression and Recommendations:    Hypothyroid -

## 2018-05-30 ENCOUNTER — Ambulatory Visit (INDEPENDENT_AMBULATORY_CARE_PROVIDER_SITE_OTHER): Payer: BLUE CROSS/BLUE SHIELD | Admitting: Family Medicine

## 2018-05-30 ENCOUNTER — Encounter: Payer: Self-pay | Admitting: Family Medicine

## 2018-05-30 VITALS — BP 117/77 | HR 72 | Ht 62.0 in | Wt 253.0 lb

## 2018-05-30 DIAGNOSIS — E892 Postprocedural hypoparathyroidism: Secondary | ICD-10-CM

## 2018-05-30 DIAGNOSIS — R5383 Other fatigue: Secondary | ICD-10-CM | POA: Diagnosis not present

## 2018-05-30 DIAGNOSIS — E038 Other specified hypothyroidism: Secondary | ICD-10-CM | POA: Diagnosis not present

## 2018-05-30 DIAGNOSIS — L309 Dermatitis, unspecified: Secondary | ICD-10-CM

## 2018-05-30 DIAGNOSIS — W57XXXA Bitten or stung by nonvenomous insect and other nonvenomous arthropods, initial encounter: Secondary | ICD-10-CM

## 2018-05-30 LAB — CBC WITH DIFFERENTIAL/PLATELET
BASOS ABS: 58 {cells}/uL (ref 0–200)
Basophils Relative: 0.6 %
EOS ABS: 154 {cells}/uL (ref 15–500)
Eosinophils Relative: 1.6 %
HCT: 42.1 % (ref 35.0–45.0)
HEMOGLOBIN: 14 g/dL (ref 11.7–15.5)
Lymphs Abs: 2851 cells/uL (ref 850–3900)
MCH: 27.5 pg (ref 27.0–33.0)
MCHC: 33.3 g/dL (ref 32.0–36.0)
MCV: 82.7 fL (ref 80.0–100.0)
MONOS PCT: 5.5 %
MPV: 9.7 fL (ref 7.5–12.5)
NEUTROS ABS: 6010 {cells}/uL (ref 1500–7800)
Neutrophils Relative %: 62.6 %
Platelets: 403 10*3/uL — ABNORMAL HIGH (ref 140–400)
RBC: 5.09 10*6/uL (ref 3.80–5.10)
RDW: 13.1 % (ref 11.0–15.0)
TOTAL LYMPHOCYTE: 29.7 %
WBC mixed population: 528 cells/uL (ref 200–950)
WBC: 9.6 10*3/uL (ref 3.8–10.8)

## 2018-05-30 LAB — COMPLETE METABOLIC PANEL WITH GFR
AG Ratio: 1.6 (calc) (ref 1.0–2.5)
ALT: 43 U/L — ABNORMAL HIGH (ref 6–29)
AST: 27 U/L (ref 10–30)
Albumin: 4.3 g/dL (ref 3.6–5.1)
Alkaline phosphatase (APISO): 59 U/L (ref 33–115)
BUN: 10 mg/dL (ref 7–25)
CALCIUM: 9.7 mg/dL (ref 8.6–10.2)
CO2: 29 mmol/L (ref 20–32)
CREATININE: 0.82 mg/dL (ref 0.50–1.10)
Chloride: 102 mmol/L (ref 98–110)
GFR, Est African American: 105 mL/min/{1.73_m2} (ref >=60)
GFR, Est Non African American: 91 mL/min/{1.73_m2} (ref >=60)
GLOBULIN: 2.7 g/dL (ref 1.9–3.7)
Glucose, Bld: 85 mg/dL (ref 65–99)
Potassium: 4.4 mmol/L (ref 3.5–5.3)
SODIUM: 139 mmol/L (ref 135–146)
TOTAL PROTEIN: 7 g/dL (ref 6.1–8.1)
Total Bilirubin: 0.5 mg/dL (ref 0.2–1.2)

## 2018-05-30 LAB — T4, FREE: FREE T4: 1.4 ng/dL (ref 0.8–1.8)

## 2018-05-30 LAB — TSH: TSH: 0.7 mIU/L

## 2018-05-30 MED ORDER — CRISABOROLE 2 % EX OINT: 1.0000 "application " | TOPICAL_OINTMENT | Freq: Two times a day (BID) | CUTANEOUS | 99 refills | Status: DC | PRN

## 2018-05-30 MED ORDER — DOXYCYCLINE HYCLATE 100 MG PO TABS: 100.0000 mg | ORAL_TABLET | Freq: Two times a day (BID) | ORAL | 0 refills | Status: DC

## 2018-05-30 MED ORDER — BETAMETHASONE DIPROPIONATE 0.05 % EX OINT: TOPICAL_OINTMENT | Freq: Every day | CUTANEOUS | 0 refills | Status: DC | PRN
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
## 2018-06-07 ENCOUNTER — Other Ambulatory Visit: Payer: Self-pay | Admitting: Family Medicine

## 2018-06-07 DIAGNOSIS — E892 Postprocedural hypoparathyroidism: Secondary | ICD-10-CM

## 2018-06-07 DIAGNOSIS — E038 Other specified hypothyroidism: Secondary | ICD-10-CM

## 2018-06-07 DIAGNOSIS — R5383 Other fatigue: Secondary | ICD-10-CM

## 2018-06-07 DIAGNOSIS — W57XXXA Bitten or stung by nonvenomous insect and other nonvenomous arthropods, initial encounter: Secondary | ICD-10-CM

## 2018-07-08 DIAGNOSIS — S0501XA Injury of conjunctiva and corneal abrasion without foreign body, right eye, initial encounter: Secondary | ICD-10-CM | POA: Diagnosis not present

## 2018-07-09 DIAGNOSIS — S0500XD Injury of conjunctiva and corneal abrasion without foreign body, unspecified eye, subsequent encounter: Secondary | ICD-10-CM | POA: Diagnosis not present

## 2018-07-14 DIAGNOSIS — S0500XD Injury of conjunctiva and corneal abrasion without foreign body, unspecified eye, subsequent encounter: Secondary | ICD-10-CM | POA: Diagnosis not present

## 2018-07-26 ENCOUNTER — Other Ambulatory Visit: Payer: Self-pay | Admitting: Family Medicine

## 2018-07-30 DIAGNOSIS — S0501XD Injury of conjunctiva and corneal abrasion without foreign body, right eye, subsequent encounter: Secondary | ICD-10-CM | POA: Diagnosis not present

## 2018-09-21 ENCOUNTER — Other Ambulatory Visit: Payer: Self-pay | Admitting: Family Medicine

## 2018-10-26 ENCOUNTER — Encounter: Payer: Self-pay | Admitting: Family Medicine

## 2018-10-26 DIAGNOSIS — E038 Other specified hypothyroidism: Secondary | ICD-10-CM

## 2018-10-27 ENCOUNTER — Encounter: Payer: Self-pay | Admitting: Family Medicine

## 2018-10-27 DIAGNOSIS — E038 Other specified hypothyroidism: Secondary | ICD-10-CM | POA: Diagnosis not present

## 2018-10-28 LAB — TSH+FREE T4: TSH W/REFLEX TO FT4: 2.52 mIU/L

## 2018-10-28 MED ORDER — ESOMEPRAZOLE MAGNESIUM 40 MG PO CPDR: 40.0000 mg | DELAYED_RELEASE_CAPSULE | Freq: Two times a day (BID) | ORAL | 1 refills | Status: DC

## 2018-11-10 ENCOUNTER — Other Ambulatory Visit: Payer: Self-pay | Admitting: Family Medicine

## 2018-11-27 ENCOUNTER — Encounter: Payer: Self-pay | Admitting: Family Medicine

## 2018-11-27 ENCOUNTER — Other Ambulatory Visit: Payer: Self-pay

## 2018-11-27 ENCOUNTER — Ambulatory Visit (INDEPENDENT_AMBULATORY_CARE_PROVIDER_SITE_OTHER): Payer: BLUE CROSS/BLUE SHIELD | Admitting: Family Medicine

## 2018-11-27 VITALS — Temp 97.3°F

## 2018-11-27 DIAGNOSIS — E892 Postprocedural hypoparathyroidism: Secondary | ICD-10-CM | POA: Diagnosis not present

## 2018-11-27 DIAGNOSIS — F430 Acute stress reaction: Secondary | ICD-10-CM

## 2018-11-27 DIAGNOSIS — E038 Other specified hypothyroidism: Secondary | ICD-10-CM

## 2018-11-27 DIAGNOSIS — J302 Other seasonal allergic rhinitis: Secondary | ICD-10-CM

## 2018-11-27 DIAGNOSIS — G4733 Obstructive sleep apnea (adult) (pediatric): Secondary | ICD-10-CM

## 2018-11-27 MED ORDER — AMBULATORY NON FORMULARY MEDICATION: 0 refills | Status: DC

## 2018-11-27 MED ORDER — PREDNISONE 20 MG PO TABS: ORAL_TABLET | ORAL | 0 refills | Status: DC

## 2018-11-27 MED ORDER — ESCITALOPRAM OXALATE 5 MG PO TABS: 5.0000 mg | ORAL_TABLET | Freq: Every day | ORAL | 1 refills | Status: DC

## 2018-11-27 NOTE — Progress Notes (Signed)
LVM advising pt to rtn call to let her know that I will need to obtain her VS and go over her medications prior to her webex visit.Marland KitchenMarland KitchenElouise Jackson, CMA   Pt reports that her allergies are bothering her. Watery and itchy eyes. Taking OTC meds. Denies fevers. Maryruth Eve, Lahoma Crocker, CMA

## 2018-11-27 NOTE — Progress Notes (Signed)
Virtual Visit via Video Note  I connected with Desiree Jackson on 11/27/18 at  1:20 PM EDT by a video enabled telemedicine application and verified that I am speaking with the correct person using two identifiers.   I discussed the limitations of evaluation and management by telemedicine and the availability of in person appointments. The patient expressed understanding and agreed to proceed.  Subjective:    CC: thyroid check and allergies. 6 month f/u   HPI:  Hypothyroidism - Taking medication regularly in the AM away from food and vitamins, etc. She has been losing hair and really tired.   Last TSH is 2.5.  She is taking 112 every other day.  She has been more stressed. No dietary changes.   F/U OSA - She hasn't used her CPAP in almost a year.  She has machine but does not have up-to-date supplies.  She just got frustrated with it and quit wearing it.  F/U seasonal allergies. - her eyes are constantly watering and runny nose x 2 weeks.  Occ tickle in her throat.  Itching in the ears but the throat.  Very itchy eyes.  Taking zyrtec and sudafed and benadryl.  Using fluticasone.    She also reports that she has been under a lot of stress with having to stay home.  Also her mother's cancer got worse and her mom is been in and out of the hospital and that is really been worrying her a lot.  She says she is never taken an antidepressant but she says is at the point where she is interested in maybe taking something that could help her.  Past medical history, Surgical history, Family history not pertinant except as noted below, Social history, Allergies, and medications have been entered into the medical record, reviewed, and corrections made.   Review of Systems: No fevers, chills, night sweats, weight loss, chest pain, or shortness of breath.   Objective:    General: Speaking clearly in complete sentences without any shortness of breath.  Alert and oriented x3.  Normal judgment. No apparent  acute distress.  Peers well groomed.  No acute distress.  She did become tearful during the conversation.   Impression and Recommendations:    Hypothyroid -because she is complaining of persistent symptoms I did offer to make a slight change to her current regimen to see if she actually feels better.  Though I think her untreated sleep apnea could be contributing to her symptoms as well.  Will increase dose to 165mcg 4 days and 12mcg 3 days per week.  Repeat each week. Plan to recheck in 6-8 week.   Acute stress -we discussed options.  She is okay with starting medication.  We will start low-dose of Lexapro.  I want to check back in with her in about 3 weeks.  Could also consider virtual visits with Janett Billow our therapist.  Seasonal allergies-it does sound like she has significant seasonal allergies she is already and on on an antihistamine and a nasal steroid spray so we will send over prednisone for a few days to see if we can get this under better control.  She says she Artie tried Singulair in the past and it was not helpful.  She is probably at the point where we would normally refer her to allergy and immunology for possible immunotherapy but at this point in time we are holding off on nonurgent referrals.  Obstructive sleep apnea-she has not been wearing her CPAP or her mask encouraged  her to consider retrying it we would need to contact home health supplier and get her new tubing etc.  She still has the machine.  This could be contributing to some of the fatigue that she has been experiencing.     I discussed the assessment and treatment plan with the patient. The patient was provided an opportunity to ask questions and all were answered. The patient agreed with the plan and demonstrated an understanding of the instructions.   The patient was advised to call back or seek an in-person evaluation if the symptoms worsen or if the condition fails to improve as anticipated.   Beatrice Lecher, MD

## 2018-11-28 ENCOUNTER — Encounter: Payer: Self-pay | Admitting: Family Medicine

## 2018-11-28 ENCOUNTER — Telehealth: Payer: Self-pay | Admitting: Family Medicine

## 2018-11-28 ENCOUNTER — Ambulatory Visit: Payer: BLUE CROSS/BLUE SHIELD | Admitting: Family Medicine

## 2018-11-28 NOTE — Telephone Encounter (Signed)
FYI: Left vm for patient to call to schedule her 3 wk follow up appointment. Thanks.

## 2018-12-01 ENCOUNTER — Telehealth: Payer: Self-pay | Admitting: Family Medicine

## 2018-12-01 MED ORDER — LEVOTHYROXINE SODIUM 125 MCG PO TABS: ORAL_TABLET | ORAL | 0 refills | Status: DC

## 2018-12-01 NOTE — Telephone Encounter (Signed)
I faxed it to [facility]. Confirmation received.Desiree Jackson, [place]

## 2018-12-01 NOTE — Telephone Encounter (Signed)
Hi Tonya, I printed a prescription for her CPAP supplies last Thursday on April 2.  I am sure you have probably already faxed it over to the company but I just wanted to make sure.  I was hoping they might be able to actually ship her the supplies.

## 2019-01-14 ENCOUNTER — Telehealth: Payer: Self-pay | Admitting: *Deleted

## 2019-01-14 DIAGNOSIS — E038 Other specified hypothyroidism: Secondary | ICD-10-CM

## 2019-01-14 NOTE — Telephone Encounter (Signed)
Will place order for recheck on tsh.Audelia Hives Lynetta Called pt and lvm advising her that she will need to have tsh rechecked .Elouise Munroe, [place]

## 2019-01-14 NOTE — Telephone Encounter (Signed)
Received a fax from [facility] stating that manufacturers on Levothyroxine has changed to Euthyrox and wanted Dr. Madilyn Fireman to be aware of this and if she is ok w/change? Maryruth Eve, Lahoma Crocker, CMA

## 2019-01-25 IMAGING — CT CT ABD-PELV W/ CM
2 of 4 series · 17 of 46 positions shown, 19 images · IV contrast (APPLIED)
Comparison: 09/05/2012

CLINICAL DATA: Left upper quadrant pain for 3 days.

EXAM:
CT ABDOMEN AND PELVIS WITH CONTRAST
TECHNIQUE: Multidetector CT imaging of the abdomen and pelvis was performed
using the standard protocol following bolus administration of
intravenous contrast.
CONTRAST:  100mL 2WRSP6-BXX IOPAMIDOL (2WRSP6-BXX) INJECTION 61%

[Series 2: axial st · axial · 0.98mm/px · z∈[-504,-44]mm · 14 of 102 slices shown, 16 images]
[im 5/102  soft-tissue]
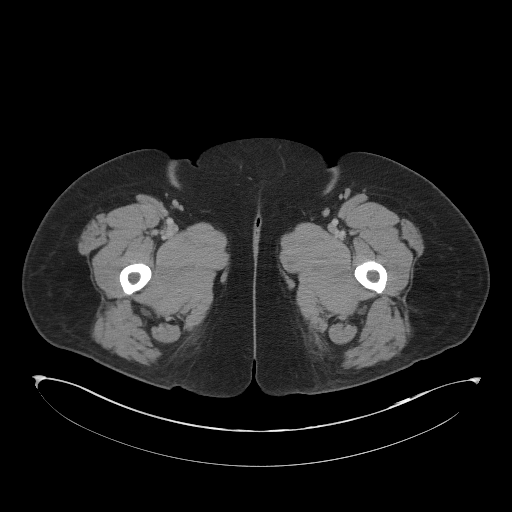
[im 5/102  bone]
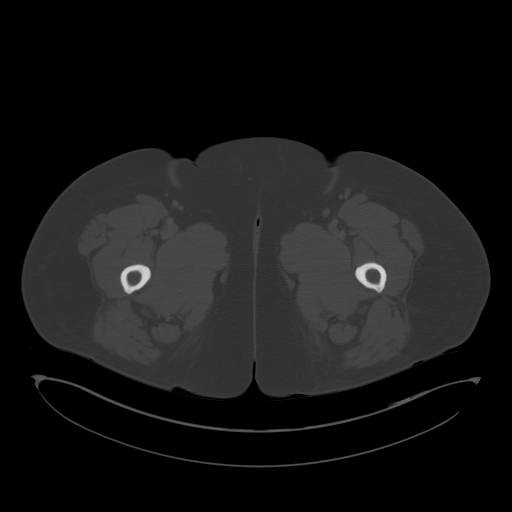
[im 14/102  soft-tissue]
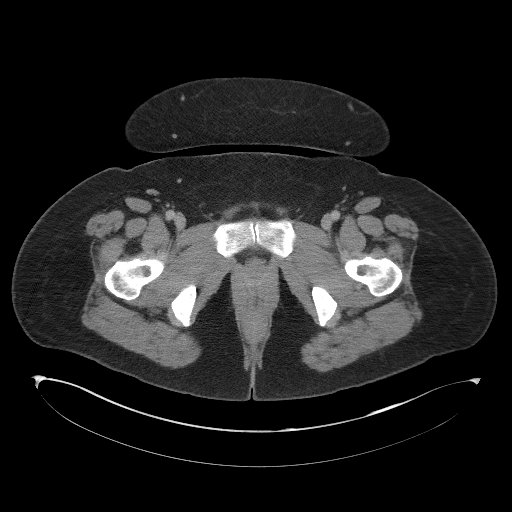
[im 18/102  soft-tissue]
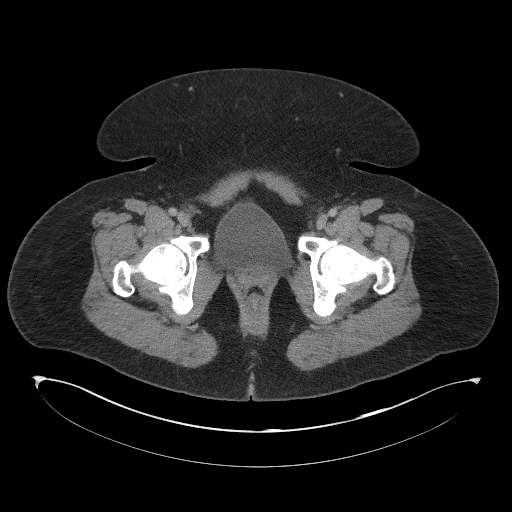
[im 27/102  soft-tissue]
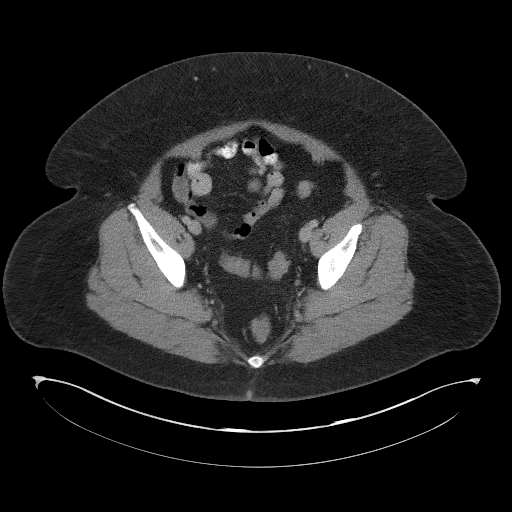
[im 36/102  soft-tissue]
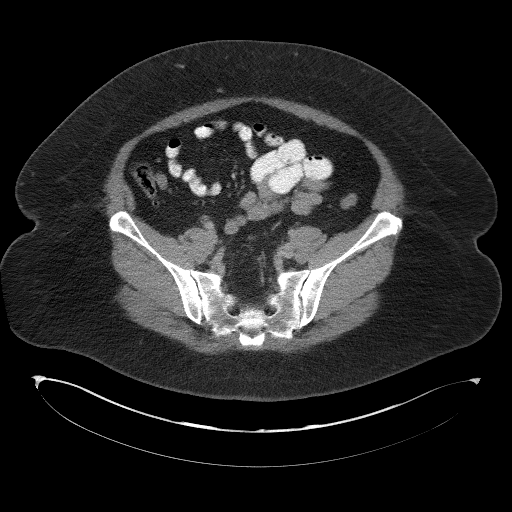
[im 40/102  soft-tissue]
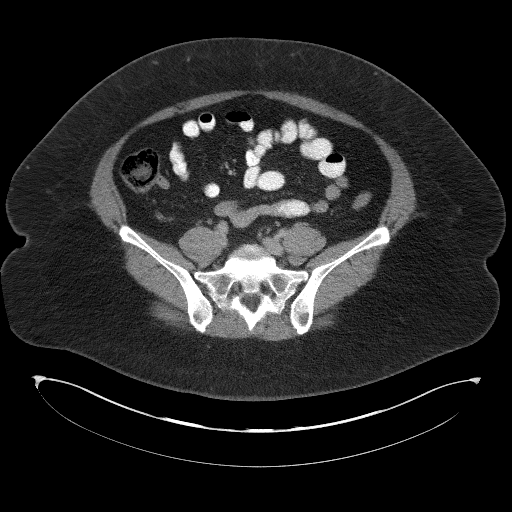
[im 49/102  soft-tissue]
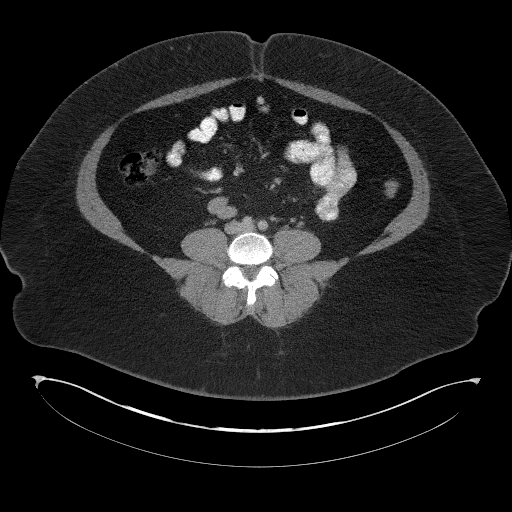
[im 53/102  soft-tissue]
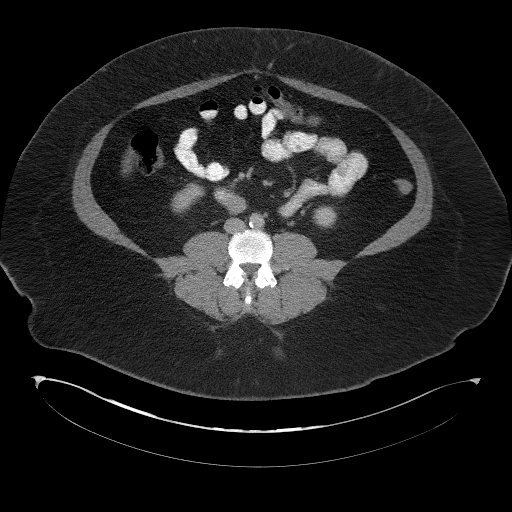
[im 62/102  soft-tissue]
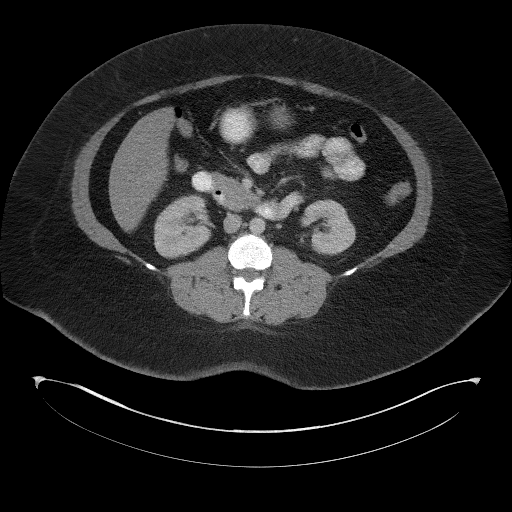
[im 62/102  bone]
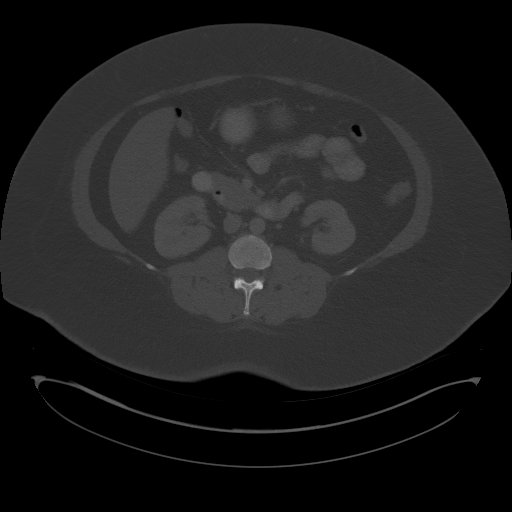
[im 66/102  soft-tissue]
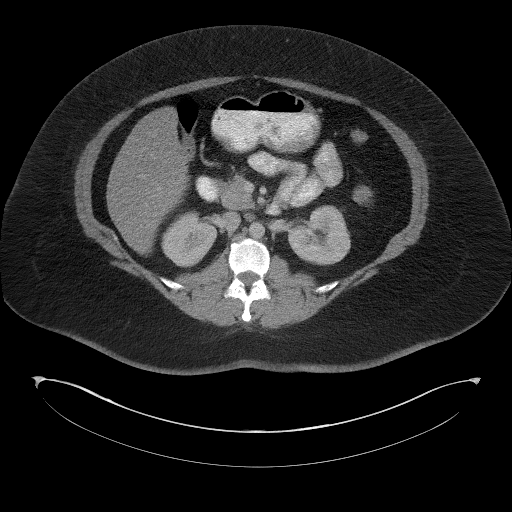
[im 75/102  soft-tissue]
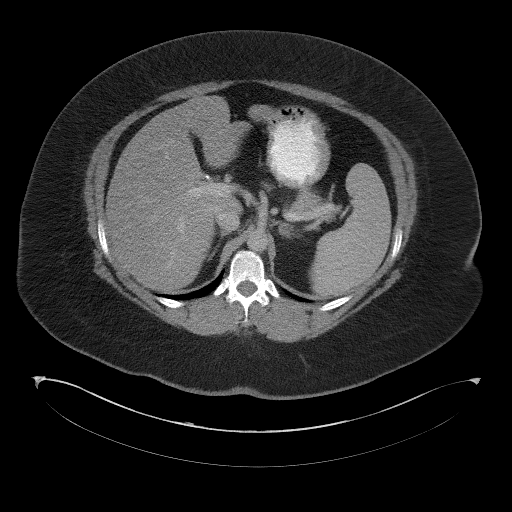
[im 84/102  soft-tissue]
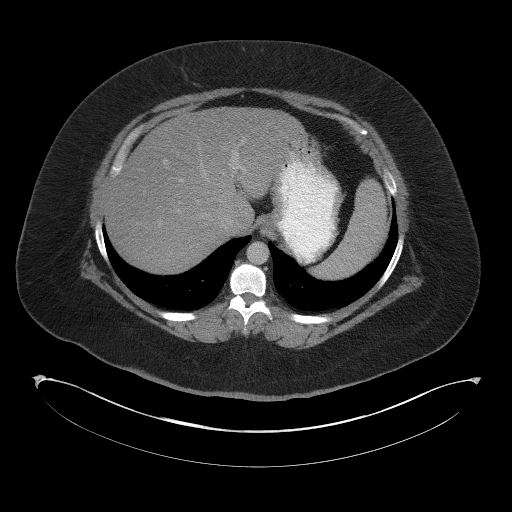
[im 88/102  soft-tissue]
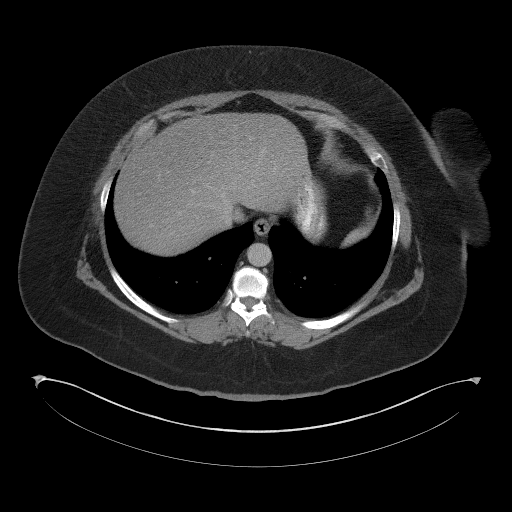
[im 97/102  soft-tissue]
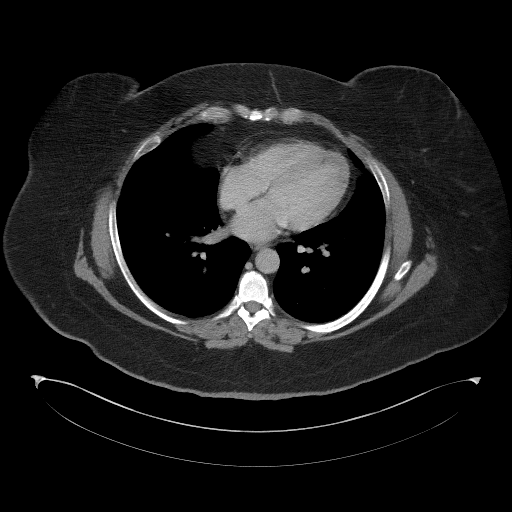

[Series 5: coronal st · coronal · 1.09mm/px · 3 of 107 slices shown]
[im 36/107  soft-tissue]
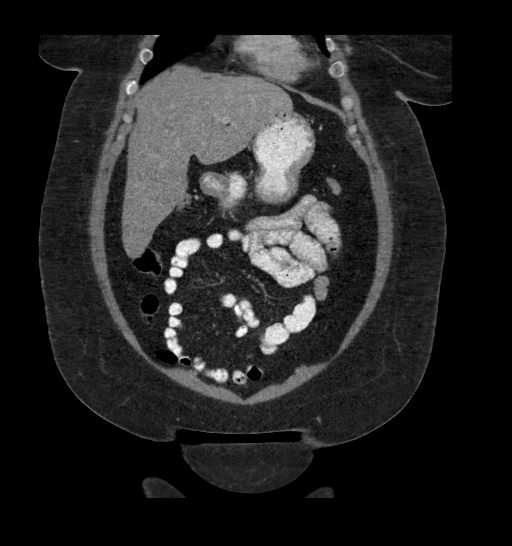
[im 48/107  soft-tissue]
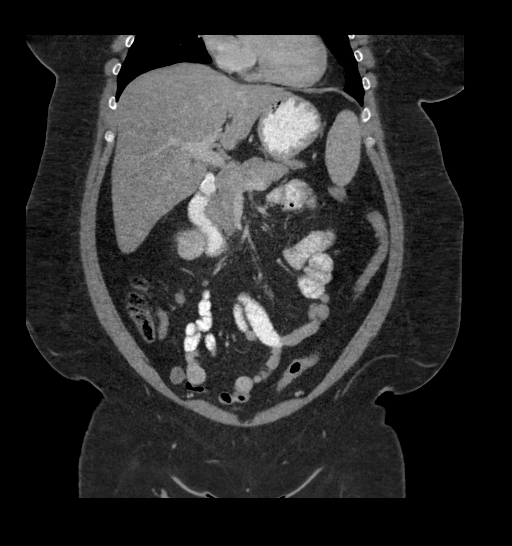
[im 59/107  soft-tissue]
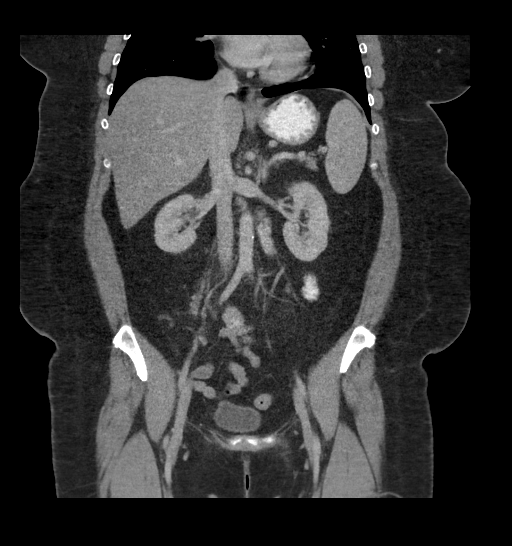

[17 of 46 positions shown; findings below may reference images not displayed]

FINDINGS: Lower Chest: No acute findings.

Hepatobiliary: No masses identified. Mild diffuse hepatic steatosis.
Prior cholecystectomy noted. No evidence of biliary dilatation.

Pancreas:  No mass or inflammatory changes.

Spleen: Within normal limits in size and appearance.

Adrenals/Urinary Tract: Stable 1.2 cm left adrenal nodule,
consistent with benign adenoma. No renal masses identified. No
evidence of hydronephrosis. Unopacified urinary bladder is
unremarkable in appearance.

Stomach/Bowel: No evidence of obstruction, inflammatory process or
abnormal fluid collections.

Vascular/Lymphatic: No pathologically enlarged lymph nodes. No
abdominal aortic aneurysm. Aortic atherosclerosis.

Reproductive: Prior hysterectomy noted. Adnexal regions are
unremarkable in appearance.

Other:  None.

Musculoskeletal:  No suspicious bone lesions identified.
IMPRESSION: No acute findings within the abdomen or pelvis.

Mild hepatic steatosis.

Aortic atherosclerosis.

## 2019-02-04 ENCOUNTER — Encounter: Payer: Self-pay | Admitting: Family Medicine

## 2019-02-05 MED ORDER — SYNTHROID 112 MCG PO TABS: 112.0000 ug | ORAL_TABLET | Freq: Every day | ORAL | 0 refills | Status: DC

## 2019-03-12 ENCOUNTER — Telehealth (INDEPENDENT_AMBULATORY_CARE_PROVIDER_SITE_OTHER): Payer: BC Managed Care – PPO | Admitting: Family Medicine

## 2019-03-12 ENCOUNTER — Encounter: Payer: Self-pay | Admitting: Family Medicine

## 2019-03-12 DIAGNOSIS — F430 Acute stress reaction: Secondary | ICD-10-CM

## 2019-03-12 DIAGNOSIS — Z20822 Contact with and (suspected) exposure to covid-19: Secondary | ICD-10-CM

## 2019-03-12 DIAGNOSIS — R059 Cough, unspecified: Secondary | ICD-10-CM

## 2019-03-12 DIAGNOSIS — J029 Acute pharyngitis, unspecified: Secondary | ICD-10-CM

## 2019-03-12 DIAGNOSIS — R05 Cough: Secondary | ICD-10-CM

## 2019-03-12 DIAGNOSIS — Z20828 Contact with and (suspected) exposure to other viral communicable diseases: Secondary | ICD-10-CM

## 2019-03-12 MED ORDER — FLUOXETINE HCL 20 MG PO TABS: 10.0000 mg | ORAL_TABLET | Freq: Every day | ORAL | 1 refills | Status: DC

## 2019-03-12 NOTE — Progress Notes (Signed)
Virtual Visit via Video Note  I connected with Desiree Jackson on 03/12/19 at  2:40 PM EDT by a video enabled telemedicine application and verified that I am speaking with the correct person using two identifiers.   I discussed the limitations of evaluation and management by telemedicine and the availability of in person appointments. The patient expressed understanding and agreed to proceed.  Pt was at home and I was in my office for the virtual visit.     Subjective:    CC:   HPI: Her mom passed away 3 weeks ago. Husband found out that a co-worker was positive for COVID.  He did have a mask on during interactions.  She has a cough  X 6 days and mild sore throat.  No SOB.  No diarrhea.  Denies fever, chills or sweats.  Her son has also had symptoms as well.  Her husband is also been trying to get tested  In regards to her mood she also wanted to let me know that she did actually try the Lexapro for about 1 month and says she just felt really sick she felt really tired and nauseated on it.  But she is also had a lot going on.  Her son has some chronic illnesses and they are currently trying to get a second opinion about what is going on with him at [facility].  Also her mother passed away about 3 weeks ago and so she is been struggling with that as well.  Past medical history, Surgical history, Family history not pertinant except as noted below, Social history, Allergies, and medications have been entered into the medical record, reviewed, and corrections made.   Review of Systems: No fevers, chills, night sweats, weight loss, chest pain, or shortness of breath.   Objective:    General: Speaking clearly in complete sentences without any shortness of breath.  Alert and oriented x3.  Normal judgment. No apparent acute distress. Well groomed.     Impression and Recommendations:    Cough/sore throat-may just be viral but potential exposure to COVID so I do think we should get her tested  as well.  It sounds like her son has been sick and her husband has been trying to get tested himself.  She would need to self quarantine until the results come back.  Did encourage her to monitor for any new symptoms or shortness of breath which can occur often times several days into the illness.   Acute stress-discussed options.  We will go ahead and remove Lexapro from the medication list since she did not tolerate it.  Will start with low-dose of fluoxetine.  Start with half a tab daily and if tolerates well after 2 weeks okay to increase to whole tab.  But if she does not tolerate it well after 2 weeks then please just discontinue it.  I discussed the assessment and treatment plan with the patient. The patient was provided an opportunity to ask questions and all were answered. The patient agreed with the plan and demonstrated an understanding of the instructions.   The patient was advised to call back or seek an in-person evaluation if the symptoms worsen or if the condition fails to improve as anticipated.   Beatrice Lecher, MD

## 2019-03-12 NOTE — Progress Notes (Signed)
lvm advising pt that I was calling prior to her Virtual visit to obtain her VS, go over medications and to discuss what she is being seen for today. Desiree Jackson, Lahoma Crocker, CMA

## 2019-03-13 ENCOUNTER — Other Ambulatory Visit: Payer: Self-pay

## 2019-03-13 DIAGNOSIS — Z20822 Contact with and (suspected) exposure to covid-19: Secondary | ICD-10-CM

## 2019-03-18 ENCOUNTER — Telehealth: Payer: Self-pay

## 2019-03-18 LAB — NOVEL CORONAVIRUS, NAA: SARS-CoV-2, NAA: DETECTED — AB

## 2019-03-18 NOTE — Telephone Encounter (Signed)
I don't think that she should be re-tested.  I think she should self quarantine for the 14 days if possible. If any family members start to to have symptoms I would recommend they tested or tested again.  We have seen where only one family member has it and other are negative.

## 2019-03-18 NOTE — Telephone Encounter (Signed)
[name], Desiree Jackson's husband, called and states Desiree Jackson's COVID-19 test was positive and he was also tested and his was negative. He states they didn't receive a call for next steps. He wants to know if she could be retested.   I did advise.   You can be with others after At least 10 days since symptoms first appeared and  At least 24 hours with no fever without fever-reducing medication and  Symptoms have improved   People with COVID-19 have had a wide range of symptoms reported - ranging from mild symptoms to severe illness. Symptoms may appear 2-14 days after exposure to the virus. People with these symptoms may have COVID-19: . Fever or chills . Cough . Shortness of breath or difficulty breathing . Fatigue . Muscle or body aches . Headache . New loss of taste or smell . Sore throat . Congestion or runny nose . Nausea or vomiting . Diarrhea .  When to Seek Emergency Medical Attention Look for emergency warning signs* for COVID-19. If someone is showing any of these signs, seek emergency medical care immediately . Trouble breathing . Persistent pain or pressure in the chest . New confusion . Inability to wake or stay awake . Bluish lips or face  How to self-isolate  . Use a separate room and bathroom for sick household members (if possible). Wendee Copp your hands often with soap and water for at least 20 seconds, especially after blowing your nose, coughing, or sneezing; going to the bathroom; and before eating or preparing food. . If soap and water are not readily available, use an alcohol-based hand sanitizer with at least 60% alcohol. Always wash hands with soap and water if hands are visibly dirty. . Provide your sick household member with clean disposable facemasks to wear at home, if available, to help prevent spreading COVID-19 to others. . Clean the sick room and bathroom, as needed, to avoid unnecessary contact with the sick person. Marland Kitchen Avoid sharing personal items like  utensils, food, and drinks.   If you feel healthy but: . Recently had close contact with a person with COVID-19 Steps to take. Stay Home and Monitor Your Health [facility]) . Stay home until 14 days after your last exposure. . Check your temperature twice a day and watch for symptoms of COVID-19. . If possible, stay away from people who are at higher-risk for getting very sick from COVID-19.  If you: . Have been diagnosed with COVID-19, or . Are waiting for test results, or . Have cough, fever, or shortness of breath, or other symptoms of COVID-19 Steps to take. Isolate Yourself from Others (Isolation) . Stay home until it is safe to be around others. . If you live with others, stay in a specific "sick room" or area and away from other people or animals, including pets. Use a separate bathroom, if available. . Read important information about caring for yourself or someone else who is sick, including when it's safe to end home isolation.

## 2019-03-18 NOTE — Telephone Encounter (Signed)
Patient's husband advised 

## 2019-03-28 DIAGNOSIS — Z20828 Contact with and (suspected) exposure to other viral communicable diseases: Secondary | ICD-10-CM | POA: Diagnosis not present

## 2019-04-28 ENCOUNTER — Encounter: Payer: Self-pay | Admitting: Family Medicine

## 2019-04-28 ENCOUNTER — Other Ambulatory Visit: Payer: Self-pay | Admitting: Internal Medicine

## 2019-04-28 DIAGNOSIS — Z20822 Contact with and (suspected) exposure to covid-19: Secondary | ICD-10-CM

## 2019-04-28 DIAGNOSIS — R059 Cough, unspecified: Secondary | ICD-10-CM

## 2019-04-28 DIAGNOSIS — Z20828 Contact with and (suspected) exposure to other viral communicable diseases: Secondary | ICD-10-CM

## 2019-04-28 DIAGNOSIS — R05 Cough: Secondary | ICD-10-CM

## 2019-04-28 MED ORDER — ESOMEPRAZOLE MAGNESIUM 40 MG PO CPDR: 40.0000 mg | DELAYED_RELEASE_CAPSULE | Freq: Two times a day (BID) | ORAL | 1 refills | Status: DC

## 2019-04-28 MED ORDER — FLUOXETINE HCL 10 MG PO TABS: 10.0000 mg | ORAL_TABLET | Freq: Every day | ORAL | 2 refills | Status: DC

## 2019-04-28 NOTE — Telephone Encounter (Signed)
It was 20mg  but was written for half a tab.  So sorry for the confusion. I just send a new one for 10mg  so doesn't have to split them.

## 2019-04-30 DIAGNOSIS — E038 Other specified hypothyroidism: Secondary | ICD-10-CM | POA: Diagnosis not present

## 2019-04-30 LAB — TSH: TSH: 6.34 mIU/L — ABNORMAL HIGH

## 2019-04-30 NOTE — Telephone Encounter (Signed)
Ok, antibodies ordered. Sorry, it was missed in [employer] note.

## 2019-05-01 ENCOUNTER — Encounter: Payer: Self-pay | Admitting: Family Medicine

## 2019-05-01 DIAGNOSIS — R05 Cough: Secondary | ICD-10-CM | POA: Diagnosis not present

## 2019-05-01 DIAGNOSIS — Z20828 Contact with and (suspected) exposure to other viral communicable diseases: Secondary | ICD-10-CM | POA: Diagnosis not present

## 2019-05-01 MED ORDER — SYNTHROID 125 MCG PO TABS: 125.0000 ug | ORAL_TABLET | Freq: Every day | ORAL | 0 refills | Status: DC

## 2019-05-05 LAB — SAR COV2 SEROLOGY (COVID19)AB(IGG),IA: SARS CoV2 AB IGG: NEGATIVE

## 2019-05-21 DIAGNOSIS — H18831 Recurrent erosion of cornea, right eye: Secondary | ICD-10-CM | POA: Diagnosis not present

## 2019-06-03 DIAGNOSIS — H18831 Recurrent erosion of cornea, right eye: Secondary | ICD-10-CM | POA: Diagnosis not present

## 2019-07-16 ENCOUNTER — Encounter: Payer: Self-pay | Admitting: Family Medicine

## 2019-07-16 DIAGNOSIS — E038 Other specified hypothyroidism: Secondary | ICD-10-CM

## 2019-07-20 ENCOUNTER — Encounter: Payer: Self-pay | Admitting: Family Medicine

## 2019-07-20 MED ORDER — FAMOTIDINE 40 MG PO TABS: 40.0000 mg | ORAL_TABLET | Freq: Two times a day (BID) | ORAL | 0 refills | Status: DC

## 2019-07-20 MED ORDER — LEVOCETIRIZINE DIHYDROCHLORIDE 5 MG PO TABS: 5.0000 mg | ORAL_TABLET | Freq: Every evening | ORAL | 0 refills | Status: DC

## 2019-07-20 NOTE — Telephone Encounter (Signed)
Both RX pended

## 2019-07-21 DIAGNOSIS — E038 Other specified hypothyroidism: Secondary | ICD-10-CM | POA: Diagnosis not present

## 2019-07-22 LAB — TSH: TSH: 0.81 mIU/L

## 2019-07-27 ENCOUNTER — Ambulatory Visit (INDEPENDENT_AMBULATORY_CARE_PROVIDER_SITE_OTHER): Payer: BC Managed Care – PPO | Admitting: Family Medicine

## 2019-07-27 ENCOUNTER — Encounter: Payer: Self-pay | Admitting: Family Medicine

## 2019-07-27 VITALS — Temp 97.6°F

## 2019-07-27 DIAGNOSIS — E038 Other specified hypothyroidism: Secondary | ICD-10-CM | POA: Diagnosis not present

## 2019-07-27 DIAGNOSIS — R109 Unspecified abdominal pain: Secondary | ICD-10-CM

## 2019-07-27 DIAGNOSIS — R21 Rash and other nonspecific skin eruption: Secondary | ICD-10-CM

## 2019-07-27 DIAGNOSIS — J3089 Other allergic rhinitis: Secondary | ICD-10-CM | POA: Diagnosis not present

## 2019-07-27 MED ORDER — SYNTHROID 125 MCG PO TABS: 125.0000 ug | ORAL_TABLET | Freq: Every day | ORAL | 1 refills | Status: DC

## 2019-07-27 NOTE — Progress Notes (Signed)
lvm advising pt that I was calling to do her prescreening prior to her visit and would try back in a few minutes.Elouise Munroe, CMA   2nd attempt to reach pt. Left another vm .Elouise Munroe, [place]

## 2019-07-27 NOTE — Assessment & Plan Note (Signed)
Dong well on current regimen. OK for refills.
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
## 2019-07-27 NOTE — Assessment & Plan Note (Signed)
Recent TSH at goal. Continue to monitor

## 2019-08-07 ENCOUNTER — Encounter: Payer: Self-pay | Admitting: *Deleted

## 2019-08-07 ENCOUNTER — Emergency Department (INDEPENDENT_AMBULATORY_CARE_PROVIDER_SITE_OTHER)
Admission: EM | Admit: 2019-08-07 | Discharge: 2019-08-07 | Disposition: A | Discharge status: Home / Self Care | Payer: BC Managed Care – PPO | Source: Home / Self Care | Attending: Emergency Medicine | Admitting: Emergency Medicine

## 2019-08-07 ENCOUNTER — Other Ambulatory Visit: Payer: Self-pay

## 2019-08-07 DIAGNOSIS — M26621 Arthralgia of right temporomandibular joint: Secondary | ICD-10-CM

## 2019-08-07 DIAGNOSIS — R05 Cough: Secondary | ICD-10-CM

## 2019-08-07 DIAGNOSIS — R519 Headache, unspecified: Secondary | ICD-10-CM | POA: Diagnosis not present

## 2019-08-07 DIAGNOSIS — R059 Cough, unspecified: Secondary | ICD-10-CM

## 2019-08-07 DIAGNOSIS — R0981 Nasal congestion: Secondary | ICD-10-CM | POA: Diagnosis not present

## 2019-08-07 DIAGNOSIS — Z20828 Contact with and (suspected) exposure to other viral communicable diseases: Secondary | ICD-10-CM

## 2019-08-07 DIAGNOSIS — Z20822 Contact with and (suspected) exposure to covid-19: Secondary | ICD-10-CM

## 2019-08-07 MED ORDER — IBUPROFEN 600 MG PO TABS: 600.0000 mg | ORAL_TABLET | Freq: Four times a day (QID) | ORAL | 0 refills | Status: DC | PRN

## 2019-08-07 MED ORDER — HYDROCOD POLST-CPM POLST ER 10-8 MG/5ML PO SUER: 5.0000 mL | Freq: Two times a day (BID) | ORAL | 0 refills | Status: DC | PRN

## 2019-08-07 MED ORDER — FLUTICASONE PROPIONATE 50 MCG/ACT NA SUSP: 2.0000 | Freq: Every day | NASAL | 0 refills | Status: DC

## 2019-08-07 MED ORDER — CYCLOBENZAPRINE HCL 10 MG PO TABS: 10.0000 mg | ORAL_TABLET | Freq: Every day | ORAL | 0 refills | Status: DC

## 2019-08-07 MED ORDER — BENZONATATE 200 MG PO CAPS: 200.0000 mg | ORAL_CAPSULE | Freq: Three times a day (TID) | ORAL | 0 refills | Status: DC | PRN
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
## 2019-08-07 NOTE — ED Triage Notes (Signed)
Pt c/o cough x 1 day. [facility] physical therapist tested positive for COVID; last PT visit was 9 days ago.

## 2019-08-07 NOTE — Discharge Instructions (Addendum)
Take 600 mg ibuprofen combined with 1 g of Tylenol 3-4 times a day as needed for headache.  Start some Mucinex or Mucinex D.  Stop the Xyzal and Zyrtec.  Try some saline nasal irrigation with a Milta Deiters med rinse and distilled water as often as you want, Flonase.  Continue the Nexium, but add the Pepcid to it.  Tessalon for the cough during the day, Tussionex for the cough at night.  Ibuprofen and Tylenol will help with your jaw joint addition to your chest soreness.  Flexeril at night to help with your temporomandibular jaw joint pain.  Covid test will take 18 to 36 hours for it to come back.

## 2019-08-10 LAB — SARS-COV-2 RNA,(COVID-19) QUALITATIVE NAAT: SARS CoV2 RNA: NOT DETECTED

## 2019-09-21 ENCOUNTER — Encounter: Payer: Self-pay | Admitting: Family Medicine

## 2019-10-06 ENCOUNTER — Telehealth (INDEPENDENT_AMBULATORY_CARE_PROVIDER_SITE_OTHER): Payer: BC Managed Care – PPO | Admitting: Family Medicine

## 2019-10-06 VITALS — Temp 97.3°F

## 2019-10-06 DIAGNOSIS — H9201 Otalgia, right ear: Secondary | ICD-10-CM | POA: Diagnosis not present

## 2019-10-06 MED ORDER — AZITHROMYCIN 250 MG PO TABS: ORAL_TABLET | ORAL | 0 refills | Status: AC

## 2019-10-06 NOTE — Progress Notes (Signed)
Headache caused by the ear pressure.  Pt cleaned out a storage shed right before she began to have the ear pain/pressure. R ear pain/pressure x 1 day.   Denies f/s/c/n/v/d/HA/body aches.

## 2019-10-06 NOTE — Progress Notes (Addendum)
Virtual Visit via Video Note  I connected with [name] on 10/06/19 at  2:40 PM EST by a video enabled telemedicine application and verified that I am speaking with the correct person using two identifiers.   I discussed the limitations of evaluation and management by telemedicine and the availability of in person appointments. The patient expressed understanding and agreed to proceed.  Subjective:    CC:   HPI: Headache caused by the ear pressure.  Pt cleaned out a storage shed right before she began to have the ear pain/pressure. R ear pain/pressure x 1 day.  Last night pain was severe and kept her awake. No popping or drainage.  Used tylenol  Or IBU.  Yesterday had some wet feeling in her ear.Now getting a burning feeling near her outer jaw and TMJ and into her neck. No nasal congestion, cough or ST.   Uses her Xyzal and Flonase. No swollen LNs in her neck. Some  Decrease in hearing on that side.  Says feels clogged.    Denies f/s/c/n/v/d/HA/body aches.    Past medical history, Surgical history, Family history not pertinant except as noted below, Social history, Allergies, and medications have been entered into the medical record, reviewed, and corrections made.   Review of Systems: No fevers, chills, night sweats, weight loss, chest pain, or shortness of breath.   Objective:    General: Speaking clearly in complete sentences without any shortness of breath.  Alert and oriented x3.  Normal judgment. No apparent acute distress.    Impression and Recommendations:    Severe right ear pain with what may have been a little bit of drainage yesterday morning and now pain spreading into the jaw area and down to her face.  No swelling or erythema at this point.  Without an exam is difficult to assess clearly but we will go ahead and treat for possible infection to go ahead and send over azithromycin based on her medication allergy.  If she is not responding to the antibiotic in the  next 48 hours then she will need to come in for me to examine her ear.  Okay to use Tylenol and or ibuprofen for  pain relief.  If she develops a fever at any point or develops any new symptoms such as sore throat or redness of the skin over the face or cheek then please give [place] a call back immediately.   Time spent 21 min in encounter   I discussed the assessment and treatment plan with the patient. The patient was provided an opportunity to ask questions and all were answered. The patient agreed with the plan and demonstrated an understanding of the instructions.   The patient was advised to call back or seek an in-person evaluation if the symptoms worsen or if the condition fails to improve as anticipated.   Beatrice Lecher, MD

## 2019-10-13 ENCOUNTER — Ambulatory Visit: Payer: BC Managed Care – PPO | Admitting: Family Medicine

## 2019-10-13 ENCOUNTER — Other Ambulatory Visit: Payer: Self-pay

## 2019-10-13 ENCOUNTER — Encounter: Payer: Self-pay | Admitting: Family Medicine

## 2019-10-13 VITALS — BP 139/81 | HR 84 | Ht 62.0 in | Wt 268.0 lb

## 2019-10-13 DIAGNOSIS — H60501 Unspecified acute noninfective otitis externa, right ear: Secondary | ICD-10-CM | POA: Diagnosis not present

## 2019-10-13 DIAGNOSIS — N644 Mastodynia: Secondary | ICD-10-CM | POA: Diagnosis not present

## 2019-10-13 MED ORDER — OFLOXACIN 0.3 % OT SOLN: 5.0000 [drp] | Freq: Every day | OTIC | 0 refills | Status: AC
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
## 2019-10-13 NOTE — Progress Notes (Signed)
She c/o R ear pain

## 2019-10-20 ENCOUNTER — Encounter: Payer: Self-pay | Admitting: Family Medicine

## 2019-10-20 DIAGNOSIS — E038 Other specified hypothyroidism: Secondary | ICD-10-CM

## 2019-10-22 DIAGNOSIS — E038 Other specified hypothyroidism: Secondary | ICD-10-CM | POA: Diagnosis not present

## 2019-10-22 LAB — TSH: TSH: 1.26 mIU/L

## 2019-10-28 DIAGNOSIS — L309 Dermatitis, unspecified: Secondary | ICD-10-CM | POA: Diagnosis not present

## 2019-10-29 ENCOUNTER — Other Ambulatory Visit: Payer: Self-pay

## 2019-10-29 ENCOUNTER — Ambulatory Visit
Admission: RE | Admit: 2019-10-29 | Discharge: 2019-10-29 | Disposition: A | Discharge status: Home / Self Care | Payer: BC Managed Care – PPO | Source: Ambulatory Visit | Attending: Family Medicine | Admitting: Family Medicine

## 2019-10-29 DIAGNOSIS — N644 Mastodynia: Secondary | ICD-10-CM

## 2019-10-29 DIAGNOSIS — N6452 Nipple discharge: Secondary | ICD-10-CM | POA: Diagnosis not present

## 2019-10-29 DIAGNOSIS — R928 Other abnormal and inconclusive findings on diagnostic imaging of breast: Secondary | ICD-10-CM | POA: Diagnosis not present

## 2019-11-18 ENCOUNTER — Other Ambulatory Visit: Payer: Self-pay | Admitting: Family Medicine

## 2019-11-25 DIAGNOSIS — E890 Postprocedural hypothyroidism: Secondary | ICD-10-CM | POA: Diagnosis not present

## 2019-11-25 DIAGNOSIS — K219 Gastro-esophageal reflux disease without esophagitis: Secondary | ICD-10-CM | POA: Diagnosis not present

## 2019-12-04 ENCOUNTER — Encounter: Payer: Self-pay | Admitting: Family Medicine

## 2019-12-07 DIAGNOSIS — Z7189 Other specified counseling: Secondary | ICD-10-CM | POA: Diagnosis not present

## 2019-12-07 DIAGNOSIS — F54 Psychological and behavioral factors associated with disorders or diseases classified elsewhere: Secondary | ICD-10-CM | POA: Diagnosis not present

## 2019-12-07 DIAGNOSIS — Z6841 Body Mass Index (BMI) 40.0 and over, adult: Secondary | ICD-10-CM | POA: Diagnosis not present

## 2019-12-08 DIAGNOSIS — Z713 Dietary counseling and surveillance: Secondary | ICD-10-CM | POA: Diagnosis not present

## 2019-12-10 DIAGNOSIS — H40053 Ocular hypertension, bilateral: Secondary | ICD-10-CM | POA: Diagnosis not present

## 2019-12-10 DIAGNOSIS — Z148 Genetic carrier of other disease: Secondary | ICD-10-CM | POA: Diagnosis not present

## 2019-12-10 DIAGNOSIS — H3552 Pigmentary retinal dystrophy: Secondary | ICD-10-CM | POA: Diagnosis not present

## 2019-12-10 DIAGNOSIS — H547 Unspecified visual loss: Secondary | ICD-10-CM | POA: Diagnosis not present

## 2019-12-31 DIAGNOSIS — F320 Major depressive disorder, single episode, mild: Secondary | ICD-10-CM | POA: Diagnosis not present

## 2019-12-31 DIAGNOSIS — F411 Generalized anxiety disorder: Secondary | ICD-10-CM | POA: Diagnosis not present

## 2020-01-08 DIAGNOSIS — Z713 Dietary counseling and surveillance: Secondary | ICD-10-CM | POA: Diagnosis not present

## 2020-01-14 DIAGNOSIS — F320 Major depressive disorder, single episode, mild: Secondary | ICD-10-CM | POA: Diagnosis not present

## 2020-01-14 DIAGNOSIS — F411 Generalized anxiety disorder: Secondary | ICD-10-CM | POA: Diagnosis not present

## 2020-01-19 DIAGNOSIS — F320 Major depressive disorder, single episode, mild: Secondary | ICD-10-CM | POA: Diagnosis not present

## 2020-01-19 DIAGNOSIS — F411 Generalized anxiety disorder: Secondary | ICD-10-CM | POA: Diagnosis not present

## 2020-01-26 DIAGNOSIS — F411 Generalized anxiety disorder: Secondary | ICD-10-CM | POA: Diagnosis not present

## 2020-01-26 DIAGNOSIS — F320 Major depressive disorder, single episode, mild: Secondary | ICD-10-CM | POA: Diagnosis not present

## 2020-01-26 DIAGNOSIS — K219 Gastro-esophageal reflux disease without esophagitis: Secondary | ICD-10-CM | POA: Diagnosis not present

## 2020-01-31 ENCOUNTER — Encounter: Payer: Self-pay | Admitting: Family Medicine

## 2020-02-01 ENCOUNTER — Other Ambulatory Visit: Payer: Self-pay

## 2020-02-01 ENCOUNTER — Other Ambulatory Visit: Payer: Self-pay | Admitting: *Deleted

## 2020-02-01 MED ORDER — SYNTHROID 125 MCG PO TABS: 125.0000 ug | ORAL_TABLET | Freq: Every day | ORAL | 2 refills | Status: DC

## 2020-02-04 DIAGNOSIS — F320 Major depressive disorder, single episode, mild: Secondary | ICD-10-CM | POA: Diagnosis not present

## 2020-02-04 DIAGNOSIS — F411 Generalized anxiety disorder: Secondary | ICD-10-CM | POA: Diagnosis not present

## 2020-02-18 DIAGNOSIS — F320 Major depressive disorder, single episode, mild: Secondary | ICD-10-CM | POA: Diagnosis not present

## 2020-02-18 DIAGNOSIS — F411 Generalized anxiety disorder: Secondary | ICD-10-CM | POA: Diagnosis not present

## 2020-02-25 DIAGNOSIS — F411 Generalized anxiety disorder: Secondary | ICD-10-CM | POA: Diagnosis not present

## 2020-02-25 DIAGNOSIS — F320 Major depressive disorder, single episode, mild: Secondary | ICD-10-CM | POA: Diagnosis not present

## 2020-03-08 DIAGNOSIS — F411 Generalized anxiety disorder: Secondary | ICD-10-CM | POA: Diagnosis not present

## 2020-03-08 DIAGNOSIS — F320 Major depressive disorder, single episode, mild: Secondary | ICD-10-CM | POA: Diagnosis not present

## 2020-03-15 DIAGNOSIS — F320 Major depressive disorder, single episode, mild: Secondary | ICD-10-CM | POA: Diagnosis not present

## 2020-03-15 DIAGNOSIS — F411 Generalized anxiety disorder: Secondary | ICD-10-CM | POA: Diagnosis not present

## 2020-03-21 DIAGNOSIS — Z723 Lack of physical exercise: Secondary | ICD-10-CM | POA: Diagnosis not present

## 2020-03-21 DIAGNOSIS — R29898 Other symptoms and signs involving the musculoskeletal system: Secondary | ICD-10-CM | POA: Diagnosis not present

## 2020-03-24 ENCOUNTER — Ambulatory Visit (INDEPENDENT_AMBULATORY_CARE_PROVIDER_SITE_OTHER): Payer: BC Managed Care – PPO | Admitting: Family Medicine

## 2020-03-24 ENCOUNTER — Encounter: Payer: Self-pay | Admitting: Family Medicine

## 2020-03-24 ENCOUNTER — Other Ambulatory Visit: Payer: Self-pay

## 2020-03-24 VITALS — BP 124/79 | HR 76 | Ht 62.0 in | Wt 267.0 lb

## 2020-03-24 DIAGNOSIS — Z01818 Encounter for other preprocedural examination: Secondary | ICD-10-CM

## 2020-03-24 DIAGNOSIS — Z0000 Encounter for general adult medical examination without abnormal findings: Secondary | ICD-10-CM

## 2020-03-24 DIAGNOSIS — F411 Generalized anxiety disorder: Secondary | ICD-10-CM | POA: Diagnosis not present

## 2020-03-24 DIAGNOSIS — F320 Major depressive disorder, single episode, mild: Secondary | ICD-10-CM | POA: Diagnosis not present
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
## 2020-03-31 DIAGNOSIS — Z0000 Encounter for general adult medical examination without abnormal findings: Secondary | ICD-10-CM | POA: Diagnosis not present

## 2020-03-31 DIAGNOSIS — K219 Gastro-esophageal reflux disease without esophagitis: Secondary | ICD-10-CM | POA: Diagnosis not present

## 2020-03-31 DIAGNOSIS — Z01818 Encounter for other preprocedural examination: Secondary | ICD-10-CM | POA: Diagnosis not present

## 2020-03-31 DIAGNOSIS — E8881 Metabolic syndrome: Secondary | ICD-10-CM | POA: Diagnosis not present

## 2020-03-31 DIAGNOSIS — G4733 Obstructive sleep apnea (adult) (pediatric): Secondary | ICD-10-CM | POA: Diagnosis not present

## 2020-04-01 ENCOUNTER — Telehealth: Payer: Self-pay | Admitting: *Deleted

## 2020-04-01 LAB — COMPLETE METABOLIC PANEL WITH GFR
AG Ratio: 1.5 (calc) (ref 1.0–2.5)
ALT: 32 U/L — ABNORMAL HIGH (ref 6–29)
AST: 26 U/L (ref 10–30)
Albumin: 4.4 g/dL (ref 3.6–5.1)
Alkaline phosphatase (APISO): 65 U/L (ref 31–125)
BUN: 12 mg/dL (ref 7–25)
CO2: 30 mmol/L (ref 20–32)
Calcium: 9.9 mg/dL (ref 8.6–10.2)
Chloride: 101 mmol/L (ref 98–110)
Creat: 0.87 mg/dL (ref 0.50–1.10)
GFR, Est African American: 97 mL/min/{1.73_m2} (ref >=60)
GFR, Est Non African American: 83 mL/min/{1.73_m2} (ref >=60)
Globulin: 2.9 g/dL (calc) (ref 1.9–3.7)
Glucose, Bld: 84 mg/dL (ref 65–99)
Potassium: 4.6 mmol/L (ref 3.5–5.3)
Sodium: 139 mmol/L (ref 135–146)
Total Bilirubin: 0.6 mg/dL (ref 0.2–1.2)
Total Protein: 7.3 g/dL (ref 6.1–8.1)

## 2020-04-01 LAB — CBC WITH DIFFERENTIAL/PLATELET
Absolute Monocytes: 539 cells/uL (ref 200–950)
Basophils Absolute: 61 cells/uL (ref 0–200)
Basophils Relative: 0.7 %
Eosinophils Absolute: 139 cells/uL (ref 15–500)
Eosinophils Relative: 1.6 %
HCT: 47.7 % — ABNORMAL HIGH (ref 35.0–45.0)
Hemoglobin: 15.5 g/dL (ref 11.7–15.5)
Lymphs Abs: 2732 cells/uL (ref 850–3900)
MCH: 27.6 pg (ref 27.0–33.0)
MCHC: 32.5 g/dL (ref 32.0–36.0)
MCV: 85 fL (ref 80.0–100.0)
MPV: 9.8 fL (ref 7.5–12.5)
Monocytes Relative: 6.2 %
Neutro Abs: 5229 cells/uL (ref 1500–7800)
Neutrophils Relative %: 60.1 %
Platelets: 462 10*3/uL — ABNORMAL HIGH (ref 140–400)
RBC: 5.61 10*6/uL — ABNORMAL HIGH (ref 3.80–5.10)
RDW: 13.5 % (ref 11.0–15.0)
Total Lymphocyte: 31.4 %
WBC: 8.7 10*3/uL (ref 3.8–10.8)

## 2020-04-01 LAB — HEMOGLOBIN A1C
Hgb A1c MFr Bld: 5.7 % of total Hgb — ABNORMAL HIGH (ref <5.7)
Mean Plasma Glucose: 117 (calc)
eAG (mmol/L): 6.5 (calc)

## 2020-04-01 LAB — TSH: TSH: 4.5 mIU/L

## 2020-04-01 NOTE — Telephone Encounter (Signed)
Form completed,faxed,confirmation received and scanned into patient's chart.

## 2020-04-14 DIAGNOSIS — F411 Generalized anxiety disorder: Secondary | ICD-10-CM | POA: Diagnosis not present

## 2020-04-14 DIAGNOSIS — F320 Major depressive disorder, single episode, mild: Secondary | ICD-10-CM | POA: Diagnosis not present

## 2020-04-21 DIAGNOSIS — F411 Generalized anxiety disorder: Secondary | ICD-10-CM | POA: Diagnosis not present

## 2020-04-21 DIAGNOSIS — F320 Major depressive disorder, single episode, mild: Secondary | ICD-10-CM | POA: Diagnosis not present

## 2020-04-28 DIAGNOSIS — F411 Generalized anxiety disorder: Secondary | ICD-10-CM | POA: Diagnosis not present

## 2020-04-28 DIAGNOSIS — F320 Major depressive disorder, single episode, mild: Secondary | ICD-10-CM | POA: Diagnosis not present

## 2020-05-05 DIAGNOSIS — F411 Generalized anxiety disorder: Secondary | ICD-10-CM | POA: Diagnosis not present

## 2020-05-05 DIAGNOSIS — F320 Major depressive disorder, single episode, mild: Secondary | ICD-10-CM | POA: Diagnosis not present

## 2020-05-12 DIAGNOSIS — F411 Generalized anxiety disorder: Secondary | ICD-10-CM | POA: Diagnosis not present

## 2020-05-12 DIAGNOSIS — F320 Major depressive disorder, single episode, mild: Secondary | ICD-10-CM | POA: Diagnosis not present

## 2020-05-24 DIAGNOSIS — F320 Major depressive disorder, single episode, mild: Secondary | ICD-10-CM | POA: Diagnosis not present

## 2020-05-24 DIAGNOSIS — F411 Generalized anxiety disorder: Secondary | ICD-10-CM | POA: Diagnosis not present

## 2020-06-08 DIAGNOSIS — F320 Major depressive disorder, single episode, mild: Secondary | ICD-10-CM | POA: Diagnosis not present

## 2020-06-08 DIAGNOSIS — F411 Generalized anxiety disorder: Secondary | ICD-10-CM | POA: Diagnosis not present

## 2020-06-22 DIAGNOSIS — F411 Generalized anxiety disorder: Secondary | ICD-10-CM | POA: Diagnosis not present

## 2020-06-22 DIAGNOSIS — F320 Major depressive disorder, single episode, mild: Secondary | ICD-10-CM | POA: Diagnosis not present

## 2020-06-29 ENCOUNTER — Encounter: Payer: Self-pay | Admitting: Family Medicine

## 2020-06-29 DIAGNOSIS — R7301 Impaired fasting glucose: Secondary | ICD-10-CM

## 2020-06-29 DIAGNOSIS — E038 Other specified hypothyroidism: Secondary | ICD-10-CM

## 2020-06-29 DIAGNOSIS — R7989 Other specified abnormal findings of blood chemistry: Secondary | ICD-10-CM

## 2020-06-30 DIAGNOSIS — F320 Major depressive disorder, single episode, mild: Secondary | ICD-10-CM | POA: Diagnosis not present

## 2020-06-30 DIAGNOSIS — F411 Generalized anxiety disorder: Secondary | ICD-10-CM | POA: Diagnosis not present

## 2020-07-14 DIAGNOSIS — F320 Major depressive disorder, single episode, mild: Secondary | ICD-10-CM | POA: Diagnosis not present

## 2020-07-14 DIAGNOSIS — F411 Generalized anxiety disorder: Secondary | ICD-10-CM | POA: Diagnosis not present

## 2020-07-26 DIAGNOSIS — F320 Major depressive disorder, single episode, mild: Secondary | ICD-10-CM | POA: Diagnosis not present

## 2020-07-26 DIAGNOSIS — F411 Generalized anxiety disorder: Secondary | ICD-10-CM | POA: Diagnosis not present

## 2020-07-28 ENCOUNTER — Encounter: Payer: Self-pay | Admitting: Family Medicine

## 2020-07-28 DIAGNOSIS — R7301 Impaired fasting glucose: Secondary | ICD-10-CM | POA: Diagnosis not present

## 2020-07-28 DIAGNOSIS — R7989 Other specified abnormal findings of blood chemistry: Secondary | ICD-10-CM | POA: Diagnosis not present

## 2020-07-28 DIAGNOSIS — E038 Other specified hypothyroidism: Secondary | ICD-10-CM | POA: Diagnosis not present

## 2020-07-29 LAB — CBC WITH DIFFERENTIAL/PLATELET
Absolute Monocytes: 470 cells/uL (ref 200–950)
Basophils Absolute: 59 cells/uL (ref 0–200)
Basophils Relative: 0.7 %
Eosinophils Absolute: 210 cells/uL (ref 15–500)
Eosinophils Relative: 2.5 %
HCT: 41.9 % (ref 35.0–45.0)
Hemoglobin: 13.9 g/dL (ref 11.7–15.5)
Lymphs Abs: 2755 cells/uL (ref 850–3900)
MCH: 27.9 pg (ref 27.0–33.0)
MCHC: 33.2 g/dL (ref 32.0–36.0)
MCV: 84.1 fL (ref 80.0–100.0)
MPV: 9.8 fL (ref 7.5–12.5)
Monocytes Relative: 5.6 %
Neutro Abs: 4906 cells/uL (ref 1500–7800)
Neutrophils Relative %: 58.4 %
Platelets: 398 10*3/uL (ref 140–400)
RBC: 4.98 10*6/uL (ref 3.80–5.10)
RDW: 13.4 % (ref 11.0–15.0)
Total Lymphocyte: 32.8 %
WBC: 8.4 10*3/uL (ref 3.8–10.8)

## 2020-07-29 LAB — HEMOGLOBIN A1C
Hgb A1c MFr Bld: 5.5 % of total Hgb (ref <5.7)
Mean Plasma Glucose: 111 (calc)
eAG (mmol/L): 6.2 (calc)

## 2020-07-29 LAB — TSH: TSH: 2.23 mIU/L

## 2020-07-29 MED ORDER — LEVOTHYROXINE SODIUM 125 MCG PO TABS: 125.0000 ug | ORAL_TABLET | Freq: Every day | ORAL | 1 refills | Status: DC

## 2020-07-29 NOTE — Telephone Encounter (Signed)
Levothyroxine cancelled at [facility].

## 2020-07-29 NOTE — Telephone Encounter (Signed)
Medication accidentally sent to [facility].  I did resend to [facility] but will need to call [facility] to cancel the prescription so that [facility] can run it.

## 2020-07-29 NOTE — Addendum Note (Signed)
Addended by: Beatrice Lecher D on: 07/29/2020 12:36 PM   Modules accepted: Orders

## 2020-07-29 NOTE — Telephone Encounter (Signed)
All labs are normal. 

## 2020-08-03 DIAGNOSIS — F320 Major depressive disorder, single episode, mild: Secondary | ICD-10-CM | POA: Diagnosis not present

## 2020-08-03 DIAGNOSIS — F411 Generalized anxiety disorder: Secondary | ICD-10-CM | POA: Diagnosis not present

## 2020-08-11 DIAGNOSIS — F411 Generalized anxiety disorder: Secondary | ICD-10-CM | POA: Diagnosis not present

## 2020-08-11 DIAGNOSIS — F320 Major depressive disorder, single episode, mild: Secondary | ICD-10-CM | POA: Diagnosis not present

## 2020-08-17 DIAGNOSIS — F411 Generalized anxiety disorder: Secondary | ICD-10-CM | POA: Diagnosis not present

## 2020-08-17 DIAGNOSIS — F320 Major depressive disorder, single episode, mild: Secondary | ICD-10-CM | POA: Diagnosis not present

## 2020-08-25 DIAGNOSIS — F320 Major depressive disorder, single episode, mild: Secondary | ICD-10-CM | POA: Diagnosis not present

## 2020-08-25 DIAGNOSIS — F411 Generalized anxiety disorder: Secondary | ICD-10-CM | POA: Diagnosis not present

## 2020-10-06 ENCOUNTER — Encounter: Payer: Self-pay | Admitting: Family Medicine

## 2020-10-06 ENCOUNTER — Other Ambulatory Visit: Payer: Self-pay

## 2020-10-06 ENCOUNTER — Ambulatory Visit: Payer: BC Managed Care – PPO | Admitting: Family Medicine

## 2020-10-06 VITALS — BP 125/85 | HR 90 | Ht 62.0 in | Wt 273.0 lb

## 2020-10-06 DIAGNOSIS — R21 Rash and other nonspecific skin eruption: Secondary | ICD-10-CM

## 2020-10-06 MED ORDER — CLOTRIMAZOLE-BETAMETHASONE 1-0.05 % EX CREA: 1.0000 "application " | TOPICAL_CREAM | Freq: Two times a day (BID) | CUTANEOUS | 0 refills | Status: DC

## 2020-10-06 NOTE — Progress Notes (Signed)
Pt reports that she has had a rash on her L breast that comes and goes for the past 2 weeks. She has tried using hydrocortisone cream on this and it hasn't helped.  Denies any changes to lotions,soaps,detergents or diet.

## 2020-10-06 NOTE — Addendum Note (Signed)
Addended by: Teddy Spike on: 10/06/2020 04:55 PM   Modules accepted: Orders
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
## 2020-10-10 LAB — FUNGAL STAIN
FUNGAL SMEAR:: NONE SEEN
MICRO NUMBER:: 11522624
SPECIMEN QUALITY:: ADEQUATE

## 2020-10-11 DIAGNOSIS — R21 Rash and other nonspecific skin eruption: Secondary | ICD-10-CM | POA: Diagnosis not present

## 2020-10-11 DIAGNOSIS — N644 Mastodynia: Secondary | ICD-10-CM | POA: Diagnosis not present

## 2020-10-25 ENCOUNTER — Other Ambulatory Visit: Payer: Self-pay | Admitting: *Deleted

## 2020-10-25 ENCOUNTER — Encounter: Payer: Self-pay | Admitting: Family Medicine

## 2020-10-25 DIAGNOSIS — E038 Other specified hypothyroidism: Secondary | ICD-10-CM

## 2020-10-26 DIAGNOSIS — E038 Other specified hypothyroidism: Secondary | ICD-10-CM | POA: Diagnosis not present

## 2020-10-26 LAB — TSH: TSH: 3.91 mIU/L

## 2020-10-28 ENCOUNTER — Other Ambulatory Visit: Payer: Self-pay | Admitting: Family Medicine

## 2020-11-15 DIAGNOSIS — Q828 Other specified congenital malformations of skin: Secondary | ICD-10-CM | POA: Diagnosis not present

## 2020-11-15 DIAGNOSIS — L309 Dermatitis, unspecified: Secondary | ICD-10-CM | POA: Diagnosis not present

## 2020-11-15 DIAGNOSIS — N904 Leukoplakia of vulva: Secondary | ICD-10-CM | POA: Diagnosis not present

## 2020-12-22 ENCOUNTER — Telehealth (INDEPENDENT_AMBULATORY_CARE_PROVIDER_SITE_OTHER): Payer: BC Managed Care – PPO | Admitting: Family Medicine

## 2020-12-22 ENCOUNTER — Encounter: Payer: Self-pay | Admitting: Family Medicine

## 2020-12-22 DIAGNOSIS — J0140 Acute pansinusitis, unspecified: Secondary | ICD-10-CM | POA: Diagnosis not present

## 2020-12-22 MED ORDER — LEVOCETIRIZINE DIHYDROCHLORIDE 5 MG PO TABS: 5.0000 mg | ORAL_TABLET | Freq: Every evening | ORAL | 3 refills | Status: DC

## 2020-12-22 MED ORDER — DOXYCYCLINE HYCLATE 100 MG PO TABS: 100.0000 mg | ORAL_TABLET | Freq: Two times a day (BID) | ORAL | 0 refills | Status: AC

## 2020-12-22 NOTE — Progress Notes (Signed)
Virtual Video Visit via MyChart Note  I connected with  Desiree Jackson on 12/22/20 at  2:20 PM EDT by the video enabled telemedicine application for MyChart, and verified that I am speaking with the correct person using two identifiers.   I introduced myself as a Designer, jewellery with the practice. We discussed the limitations of evaluation and management by telemedicine and the availability of in person appointments. The patient expressed understanding and agreed to proceed.  Participating parties in this visit include: The patient and the nurse practitioner listed.  The patient is: At home I am: In the office - [facility]  Subjective:    CC:  Chief Complaint  Patient presents with  . Ear Pain    HPI: Desiree Jackson is a 40 y.o. year old female presenting today via [place] today for sinus pressure and ear pain.  Patient states that for the past 2 weeks she has had cold symptoms that have continued to worsening. She is experiencing frontal/maxillary sinus pressure up to 8/10 at times, nasal congestion, rhinorrhea, PND, scratchy throat, occasional cough with chest tightness, and severe right ear pain. She reports the ear pain can get up to 8/10 aching/throbbing that worsens with eating and swallowing. She states her hearing sounds a little muffled on that side. She denies any popping or drainage. Reports she has been taking Sudafed and ibuprofen with minimal improvement. She denies any fevers, chest pain, shortness of breath, GI symptoms, loss of taste/smell, urinary changes.   Husband is sick with similar URI symptoms. She has not tested for COVID, but husband tested negative.   Past medical history, Surgical history, Family history not pertinant except as noted below, Social history, Allergies, and medications have been entered into the medical record, reviewed, and corrections made.   Review of Systems:  All review of systems negative except what is listed in  the HPI   Objective:    General:  Speaking clearly in complete sentences. Absent shortness of breath noted.   Alert and oriented x3.   Normal judgment.  Absent acute distress.   Impression and Recommendations:    1. Acute pansinusitis, recurrence not specified Patient with duration and symptoms consistent with ABRS and possibly right ear AOM. Will go ahead and treat with doxycycline to cover both. Encouraged her to continue symptom management and supportive care including rest, hydration, humidifier use, warm compresses, steam shower, OTC cold medicines and analgesics. Educated on signs and symptoms requiring further evaluation. Recommend she take a COVID test as this would be needed if we were to need to see her in office next week if symptoms are not improving.   - doxycycline (VIBRA-TABS) 100 MG tablet; Take 1 tablet (100 mg total) by mouth 2 (two) times daily for 5 days.  Dispense: 10 tablet; Refill: 0 - levocetirizine (XYZAL) 5 MG tablet; Take 1 tablet (5 mg total) by mouth every evening.  Dispense: 90 tablet; Refill: 3  Follow-up if symptoms worsen or fail to improve.    I discussed the assessment and treatment plan with the patient. The patient was provided an opportunity to ask questions and all were answered. The patient agreed with the plan and demonstrated an understanding of the instructions.   The patient was advised to call back or seek an in-person evaluation if the symptoms worsen or if the condition fails to improve as anticipated.  I provided 20 minutes of non-face-to-face interaction with this [employer] visit including intake, same-day documentation, and chart review.  Terrilyn Saver, NP

## 2021-01-12 ENCOUNTER — Encounter: Payer: Self-pay | Admitting: Family Medicine

## 2021-01-12 DIAGNOSIS — E038 Other specified hypothyroidism: Secondary | ICD-10-CM

## 2021-01-17 DIAGNOSIS — E038 Other specified hypothyroidism: Secondary | ICD-10-CM | POA: Diagnosis not present

## 2021-01-18 ENCOUNTER — Encounter: Payer: Self-pay | Admitting: Family Medicine

## 2021-01-18 DIAGNOSIS — E038 Other specified hypothyroidism: Secondary | ICD-10-CM

## 2021-01-18 LAB — TSH: TSH: 2.29 u[IU]/mL (ref 0.450–4.500)

## 2021-01-18 NOTE — Telephone Encounter (Signed)
All labs are normal. 

## 2021-01-20 MED ORDER — LEVOTHYROXINE SODIUM 137 MCG PO TABS: 137.0000 ug | ORAL_TABLET | Freq: Every day | ORAL | 0 refills | Status: DC

## 2021-01-31 ENCOUNTER — Encounter: Payer: Self-pay | Admitting: Family Medicine

## 2021-01-31 ENCOUNTER — Telehealth (INDEPENDENT_AMBULATORY_CARE_PROVIDER_SITE_OTHER): Payer: BC Managed Care – PPO | Admitting: Family Medicine

## 2021-01-31 DIAGNOSIS — U071 COVID-19: Secondary | ICD-10-CM

## 2021-01-31 MED ORDER — BENZONATATE 200 MG PO CAPS: 200.0000 mg | ORAL_CAPSULE | Freq: Three times a day (TID) | ORAL | 0 refills | Status: DC | PRN

## 2021-01-31 NOTE — Progress Notes (Signed)
Pt tested positive with a at home COVID test on Sunday.   Her sxs have been cough,headache,sinus,nasal congestion, chest pain (from constant cough?).   She has been taking Mucinex, Sudafed, and IBU for her sxs.   She has had Sweats. May have had a fever the first night but reports that the cough is the worse. She stated that it is really bad at night. She hasn't been able to sleep due to this.

## 2021-01-31 NOTE — Progress Notes (Signed)
Virtual Visit via Video Note  I connected with Desiree Jackson on 01/31/21 at 11:30 AM EDT by a video enabled telemedicine application and verified that I am speaking with the correct person using two identifiers.   I discussed the limitations of evaluation and management by telemedicine and the availability of in person appointments. The patient expressed understanding and agreed to proceed.  Patient location: at home Provider location: in office  Subjective:    CC: COVDI pos  HPI:  Pt tested positive with a at home COVID test on Sunday.  Sxs started on Saturday. Her sxs have been cough,headache,sinus,nasal congestion, chest pain (from constant cough?).  Maybe fever the first night.  She has been taking Mucinex, Sudafed, and IBU for her sxs. She has had Sweats. May have had a fever the first night but reports that the cough is the worse. She stated that it is really bad at night. She hasn't been able to sleep due to this. Whole family is sick. She has had the vaccine. No booster. Get dizzy from coughing.   Past medical history, Surgical history, Family history not pertinant except as noted below, Social history, Allergies, and medications have been entered into the medical record, reviewed, and corrections made.    Objective:    General: Speaking clearly in complete sentences without any shortness of breath.  Alert and oriented x3.  Normal judgment. No apparent acute distress.    Impression and Recommendations:    No problem-specific Assessment & Plan notes found for this encounter.  COVID - 19 - discussed dx. She is overall low risk.   cough med called in .  call if not better in a week. Stay hydrated.  Call if worsening sxs.  Can check pulse ox if develop SOB.     No orders of the defined types were placed in this encounter.   Meds ordered this encounter  Medications  . benzonatate (TESSALON) 200 MG capsule    Sig: Take 1 capsule (200 mg total) by mouth 3 (three) times  daily as needed for cough.    Dispense:  30 capsule    Refill:  0     I discussed the assessment and treatment plan with the patient. The patient was provided an opportunity to ask questions and all were answered. The patient agreed with the plan and demonstrated an understanding of the instructions.   The patient was advised to call back or seek an in-person evaluation if the symptoms worsen or if the condition fails to improve as anticipated.   Beatrice Lecher, MD

## 2021-04-15 ENCOUNTER — Other Ambulatory Visit: Payer: Self-pay | Admitting: Family Medicine

## 2021-04-15 DIAGNOSIS — E038 Other specified hypothyroidism: Secondary | ICD-10-CM

## 2021-04-16 ENCOUNTER — Encounter: Payer: Self-pay | Admitting: Family Medicine

## 2021-04-16 DIAGNOSIS — E038 Other specified hypothyroidism: Secondary | ICD-10-CM

## 2021-04-17 ENCOUNTER — Other Ambulatory Visit: Payer: Self-pay

## 2021-04-17 DIAGNOSIS — E038 Other specified hypothyroidism: Secondary | ICD-10-CM

## 2021-04-17 NOTE — Addendum Note (Signed)
Addended by: Fonnie Mu on: 04/17/2021 09:40 AM   Modules accepted: Orders

## 2021-04-19 ENCOUNTER — Other Ambulatory Visit: Payer: Self-pay

## 2021-04-19 DIAGNOSIS — E038 Other specified hypothyroidism: Secondary | ICD-10-CM

## 2021-04-20 LAB — TSH: TSH: 2.15 u[IU]/mL (ref 0.450–4.500)

## 2021-04-20 NOTE — Progress Notes (Signed)
Your lab work is within acceptable range and there are no concerning findings.   ?

## 2021-05-11 DIAGNOSIS — Z5181 Encounter for therapeutic drug level monitoring: Secondary | ICD-10-CM | POA: Diagnosis not present

## 2021-05-11 DIAGNOSIS — Q828 Other specified congenital malformations of skin: Secondary | ICD-10-CM | POA: Diagnosis not present

## 2021-05-11 DIAGNOSIS — L308 Other specified dermatitis: Secondary | ICD-10-CM | POA: Diagnosis not present

## 2021-05-11 DIAGNOSIS — N904 Leukoplakia of vulva: Secondary | ICD-10-CM | POA: Diagnosis not present

## 2021-05-25 ENCOUNTER — Other Ambulatory Visit: Payer: Self-pay

## 2021-05-25 ENCOUNTER — Emergency Department (INDEPENDENT_AMBULATORY_CARE_PROVIDER_SITE_OTHER)
Admission: EM | Admit: 2021-05-25 | Discharge: 2021-05-25 | Disposition: A | Discharge status: Home / Self Care | Payer: BC Managed Care – PPO | Source: Home / Self Care

## 2021-05-25 DIAGNOSIS — J309 Allergic rhinitis, unspecified: Secondary | ICD-10-CM | POA: Diagnosis not present

## 2021-05-25 DIAGNOSIS — J029 Acute pharyngitis, unspecified: Secondary | ICD-10-CM | POA: Diagnosis not present

## 2021-05-25 LAB — POCT RAPID STREP A (OFFICE): Rapid Strep A Screen: NEGATIVE

## 2021-05-25 MED ORDER — AZITHROMYCIN 250 MG PO TABS: 250.0000 mg | ORAL_TABLET | Freq: Every day | ORAL | 0 refills | Status: DC

## 2021-05-25 MED ORDER — FEXOFENADINE HCL 180 MG PO TABS: 180.0000 mg | ORAL_TABLET | Freq: Every day | ORAL | 0 refills | Status: DC
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
## 2021-05-25 NOTE — Discharge Instructions (Addendum)
Advised patient to take medication as directed with food to completion.  Advised/instructed patient to discontinue current Xyzal and pseudoephedrine and start Allegra daily for the next 7 days for concurrent postnasal drip and drainage.  Encourage patient increase daily water intake while taking these medications.

## 2021-05-25 NOTE — ED Triage Notes (Signed)
Pt states that she has a sore throat. X4 days. Pt states that she is vaccinated.

## 2021-05-27 LAB — CULTURE, GROUP A STREP
MICRO NUMBER:: 12443376
SPECIMEN QUALITY:: ADEQUATE

## 2021-08-15 DIAGNOSIS — L309 Dermatitis, unspecified: Secondary | ICD-10-CM | POA: Diagnosis not present

## 2021-08-15 DIAGNOSIS — Z5181 Encounter for therapeutic drug level monitoring: Secondary | ICD-10-CM | POA: Diagnosis not present

## 2021-08-15 DIAGNOSIS — N904 Leukoplakia of vulva: Secondary | ICD-10-CM | POA: Diagnosis not present

## 2021-08-16 ENCOUNTER — Telehealth: Payer: BC Managed Care – PPO | Admitting: Physician Assistant

## 2021-08-16 DIAGNOSIS — B9689 Other specified bacterial agents as the cause of diseases classified elsewhere: Secondary | ICD-10-CM

## 2021-08-16 DIAGNOSIS — J019 Acute sinusitis, unspecified: Secondary | ICD-10-CM

## 2021-08-16 MED ORDER — BENZONATATE 100 MG PO CAPS
100.0000 mg | ORAL_CAPSULE | Freq: Three times a day (TID) | ORAL | 0 refills | Status: DC | PRN
Start: 2021-08-16 — End: 2022-02-22

## 2021-08-16 MED ORDER — AZITHROMYCIN 250 MG PO TABS: ORAL_TABLET | ORAL | 0 refills | Status: AC

## 2021-08-16 MED ORDER — FLUTICASONE PROPIONATE 50 MCG/ACT NA SUSP: 2.0000 | Freq: Every day | NASAL | 0 refills | Status: DC

## 2021-08-16 NOTE — Patient Instructions (Signed)
Desiree Jackson, thank you for joining Leeanne Rio, PA-C for today's virtual visit.  While this provider is not your primary care provider (PCP), if your PCP is located in our provider database this encounter information will be shared with them immediately following your visit.  Consent: (Patient) Desiree Jackson provided verbal consent for this virtual visit at the beginning of the encounter.  Current Medications:  Current Outpatient Medications:    AMBULATORY NON FORMULARY MEDICATION, Medication Name: Needs CPAP supplies including the tubing.  She already has the machine.  Previous study in 2016., Disp: 1 vial, Rfl: 0   azithromycin (ZITHROMAX) 250 MG tablet, Take 1 tablet (250 mg total) by mouth daily. Take first 2 tablets together, then 1 every day until finished., Disp: 6 tablet, Rfl: 0   benzonatate (TESSALON) 200 MG capsule, Take 1 capsule (200 mg total) by mouth 3 (three) times daily as needed for cough., Disp: 30 capsule, Rfl: 0   clobetasol cream (TEMOVATE) 0.05 %, Apply topically., Disp: , Rfl:    clotrimazole-betamethasone (LOTRISONE) cream, Apply 1 application topically 2 (two) times daily., Disp: 30 g, Rfl: 0   esomeprazole (NEXIUM) 40 MG capsule, TAKE 1 CAPSULE BY MOUTH TWICE DAILY BEFORE A MEAL, Disp: 270 capsule, Rfl: 1   fexofenadine (ALLEGRA ALLERGY) 180 MG tablet, Take 1 tablet (180 mg total) by mouth daily for 15 days., Disp: 15 tablet, Rfl: 0   levocetirizine (XYZAL) 5 MG tablet, Take 1 tablet (5 mg total) by mouth every evening., Disp: 90 tablet, Rfl: 3   levothyroxine (SYNTHROID) 137 MCG tablet, TAKE 1 TABLET(137 MCG) BY MOUTH DAILY BEFORE AND BREAKFAST, Disp: 90 tablet, Rfl: 3   Medications ordered in this encounter:  No orders of the defined types were placed in this encounter.    *If you need refills on other medications prior to your next appointment, please contact your pharmacy*  Follow-Up: Call back or seek an in-person evaluation if the  symptoms worsen or if the condition fails to improve as anticipated.  Other Instructions Please take antibiotic as directed.  Increase fluid intake.  Use Saline nasal spray.  Take a daily multivitamin. Ok to use the [employer] as directed.  Place a humidifier in the bedroom.  Please call or return clinic if symptoms are not improving.  Sinusitis Sinusitis is redness, soreness, and swelling (inflammation) of the paranasal sinuses. Paranasal sinuses are air pockets within the bones of your face (beneath the eyes, the middle of the forehead, or above the eyes). In healthy paranasal sinuses, mucus is able to drain out, and air is able to circulate through them by way of your nose. However, when your paranasal sinuses are inflamed, mucus and air can become trapped. This can allow bacteria and other germs to grow and cause infection. Sinusitis can develop quickly and last only a short time (acute) or continue over a long period (chronic). Sinusitis that lasts for more than 12 weeks is considered chronic.  CAUSES  Causes of sinusitis include: Allergies. Structural abnormalities, such as displacement of the cartilage that separates your nostrils (deviated septum), which can decrease the air flow through your nose and sinuses and affect sinus drainage. Functional abnormalities, such as when the small hairs (cilia) that line your sinuses and help remove mucus do not work properly or are not present. SYMPTOMS  Symptoms of acute and chronic sinusitis are the same. The primary symptoms are pain and pressure around the affected sinuses. Other symptoms include: Upper toothache. Earache. Headache.  Bad breath. Decreased sense of smell and taste. A cough, which worsens when you are lying flat. Fatigue. Fever. Thick drainage from your nose, which often is green and may contain pus (purulent). Swelling and warmth over the affected sinuses. DIAGNOSIS  Your caregiver will perform a physical exam.  During the exam, your caregiver may: Look in your nose for signs of abnormal growths in your nostrils (nasal polyps). Tap over the affected sinus to check for signs of infection. View the inside of your sinuses (endoscopy) with a special imaging device with a light attached (endoscope), which is inserted into your sinuses. If your caregiver suspects that you have chronic sinusitis, one or more of the following tests may be recommended: Allergy tests. Nasal culture A sample of mucus is taken from your nose and sent to a lab and screened for bacteria. Nasal cytology A sample of mucus is taken from your nose and examined by your caregiver to determine if your sinusitis is related to an allergy. TREATMENT  Most cases of acute sinusitis are related to a viral infection and will resolve on their own within 10 days. Sometimes medicines are prescribed to help relieve symptoms (pain medicine, decongestants, nasal steroid sprays, or saline sprays).  However, for sinusitis related to a bacterial infection, your caregiver will prescribe antibiotic medicines. These are medicines that will help kill the bacteria causing the infection.  Rarely, sinusitis is caused by a fungal infection. In theses cases, your caregiver will prescribe antifungal medicine. For some cases of chronic sinusitis, surgery is needed. Generally, these are cases in which sinusitis recurs more than 3 times per year, despite other treatments. HOME CARE INSTRUCTIONS  Drink plenty of water. Water helps thin the mucus so your sinuses can drain more easily. Use a humidifier. Inhale steam 3 to 4 times a day (for example, sit in the bathroom with the shower running). Apply a warm, moist washcloth to your face 3 to 4 times a day, or as directed by your caregiver. Use saline nasal sprays to help moisten and clean your sinuses. Take over-the-counter or prescription medicines for pain, discomfort, or fever only as directed by your caregiver. SEEK  IMMEDIATE MEDICAL CARE IF: You have increasing pain or severe headaches. You have nausea, vomiting, or drowsiness. You have swelling around your face. You have vision problems. You have a stiff neck. You have difficulty breathing. MAKE SURE YOU:  Understand these instructions. Will watch your condition. Will get help right away if you are not doing well or get worse. Document Released: 08/13/2005 Document Revised: 11/05/2011 Document Reviewed: 08/28/2011 [facility] Patient Information 2014 [place], [place].   If you have been instructed to have an in-person evaluation today at a local Urgent Care facility, please use the link below. It will take you to a list of all of our available [facility], including address, phone number and hours of operation. Please do not delay care.  [facility]  If you or a family member do not have a primary care provider, use the link below to schedule a visit and establish care. When you choose a [facility] primary care physician or advanced practice provider, you gain a long-term partner in health. Find a Primary Care Provider  Learn more about [facility] in-office and virtual care options: [facility]

## 2021-08-16 NOTE — Progress Notes (Signed)
Virtual Visit Consent   Desiree Jackson, you are scheduled for a virtual visit with a [facility] provider today.     Just as with appointments in the office, your consent must be obtained to participate.  Your consent will be active for this visit and any virtual visit you may have with one of our providers in the next 365 days.     If you have a MyChart account, a copy of this consent can be sent to you electronically.  All virtual visits are billed to your insurance company just like a traditional visit in the office.    As this is a virtual visit, video technology does not allow for your provider to perform a traditional examination.  This may limit your provider's ability to fully assess your condition.  If your provider identifies any concerns that need to be evaluated in person or the need to arrange testing (such as labs, EKG, etc.), we will make arrangements to do so.     Although advances in technology are sophisticated, we cannot ensure that it will always work on either your end or our end.  If the connection with a video visit is poor, the visit may have to be switched to a telephone visit.  With either a video or telephone visit, we are not always able to ensure that we have a secure connection.     I need to obtain your verbal consent now.   Are you willing to proceed with your visit today?    Desiree Jackson has provided verbal consent on 08/16/2021 for a virtual visit (video or telephone).   Leeanne Rio, [place]   Date: 08/16/2021 11:08 AM   Virtual Visit via Video Note   I, Leeanne Rio, connected with  Desiree Jackson  ([id number], 1980-12-07) on 08/16/21 at 11:00 AM EST by a video-enabled telemedicine application and verified that I am speaking with the correct person using two identifiers.  Location: Patient: Virtual Visit Location Patient: Home Provider: Virtual Visit Location Provider: Home Office   I discussed the limitations of evaluation  and management by telemedicine and the availability of in person appointments. The patient expressed understanding and agreed to proceed.    History of Present Illness: Desiree Jackson is a 40 y.o. who identifies as a female who was assigned female at birth, and is being seen today for possible sinusitis. Notes almost 2 weeks of nasal and head congestion with cough. Was initially starting to improve but over the past several days has deteriorated with increased congestion and cough, now with sinus pain and bilateral ear pain. Denies chest pain or SOB. Multiple COVID tests since onset have been negative.  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   Hashimoto's disease 12/31/2014   AR (allergic rhinitis) 08/16/2014   Cervical muscle pain 06/09/2013   Scalp pain 06/09/2013   Menorrhagia 03/19/2013   Pelvic pain 03/19/2013   Paresthesias/numbness 10/30/2012   Hypothyroidism 09/23/2012    Allergies:  Allergies  Allergen Reactions   Fish Allergy Anaphylaxis   Fish-Derived Products  Anaphylaxis   Penicillins Anaphylaxis   Shellfish Allergy Anaphylaxis   Strawberry Extract Anaphylaxis   Doxycycline Nausea And Vomiting   Lexapro [Escitalopram] Other (See Comments)    Fatigue and nausea   Medications:  Current Outpatient Medications:    azithromycin (ZITHROMAX) 250 MG tablet, Take 2 tablets on day 1, then 1 tablet daily on days 2 through 5, Disp: 6 tablet, Rfl: 0   benzonatate (TESSALON) 100 MG capsule, Take 1 capsule (100 mg total) by  mouth 3 (three) times daily as needed for cough., Disp: 30 capsule, Rfl: 0   fluticasone (FLONASE) 50 MCG/ACT nasal spray, Place 2 sprays into both nostrils daily., Disp: 16 g, Rfl: 0   AMBULATORY NON FORMULARY MEDICATION, Medication Name: Needs CPAP supplies including the tubing.  She already has the machine.  Previous study in 2016., Disp: 1 vial, Rfl: 0   clobetasol cream (TEMOVATE) 0.05 %, Apply topically., Disp: , Rfl:    clotrimazole-betamethasone (LOTRISONE) cream, Apply 1 application topically 2 (two) times daily., Disp: 30 g, Rfl: 0   esomeprazole (NEXIUM) 40 MG capsule, TAKE 1 CAPSULE BY MOUTH TWICE DAILY BEFORE A MEAL, Disp: 270 capsule, Rfl: 1   fexofenadine (ALLEGRA ALLERGY) 180 MG tablet, Take 1 tablet (180 mg total) by mouth daily for 15 days., Disp: 15 tablet, Rfl: 0   levocetirizine (XYZAL) 5 MG tablet, Take 1 tablet (5 mg total) by mouth every evening., Disp: 90 tablet, Rfl: 3   levothyroxine (SYNTHROID) 137 MCG tablet, TAKE 1 TABLET(137 MCG) BY MOUTH DAILY BEFORE AND BREAKFAST, Disp: 90 tablet, Rfl: 3  Observations/Objective: Patient is well-developed, well-nourished in no acute distress.  Resting comfortably at home.  Head is normocephalic, atraumatic.  No labored breathing. Speech is clear and coherent with logical content.  Patient is alert and oriented at baseline.   Assessment and Plan: 1. Acute bacterial sinusitis - azithromycin (ZITHROMAX) 250 MG tablet; Take 2 tablets on day 1, then 1 tablet daily on days 2 through 5  Dispense: 6 tablet; Refill: 0 - benzonatate (TESSALON) 100 MG capsule; Take 1 capsule (100 mg total) by mouth 3 (three) times daily as needed for cough.  Dispense: 30 capsule; Refill: 0 - fluticasone (FLONASE) 50 MCG/ACT nasal spray; Place 2 sprays into both nostrils daily.  Dispense: 16 g; Refill: 0  Rx Azithromycin due to antibiotic allergies.  Increase fluids.  Rest.  Saline nasal spray.  Probiotic.  Mucinex as directed.  Humidifier in bedroom.  Flonase and Tessalon per orders.  Call or return to clinic if symptoms are not improving.   Follow Up Instructions: I discussed the assessment and treatment plan with the patient. The patient was provided an opportunity to ask questions and all were answered. The patient agreed with the plan and demonstrated an understanding of the instructions.  A copy of instructions were sent to the patient via MyChart unless otherwise noted below.   The patient was advised to call back or seek an in-person evaluation if the symptoms worsen or if the condition fails to improve as anticipated.  Time:  I spent 12 minutes with the patient via telehealth technology discussing the above problems/concerns.    Leeanne Rio, PA-C

## 2021-08-17 ENCOUNTER — Telehealth: Payer: BC Managed Care – PPO | Admitting: Family Medicine

## 2021-11-03 ENCOUNTER — Other Ambulatory Visit: Payer: Self-pay | Admitting: *Deleted

## 2021-11-03 MED ORDER — ESOMEPRAZOLE MAGNESIUM 40 MG PO CPDR: DELAYED_RELEASE_CAPSULE | ORAL | 1 refills | Status: DC

## 2021-12-10 ENCOUNTER — Other Ambulatory Visit: Payer: Self-pay | Admitting: Family Medicine

## 2021-12-10 DIAGNOSIS — J0140 Acute pansinusitis, unspecified: Secondary | ICD-10-CM

## 2021-12-11 ENCOUNTER — Encounter: Payer: Self-pay | Admitting: Family Medicine

## 2022-01-03 IMAGING — US US BREAST*R* LIMITED INC AXILLA
1 series · 3 of 3 positions shown · non-contrast
Comparison: None

ACR Breast Density Category a: The breast tissue is almost entirely
fatty.

CLINICAL DATA: Patient presents for diffuse right breast
tenderness. Patient reports the tenderness has improved over the
last week. Additionally, patient reports 2 spontaneous episodes of
discharge from the right nipple over the past week. The last episode
was approximately 5 days ago. Patient states the discharge occurred
at the same time the breast was tender. Overall the breast
tenderness has improved/resolved as well.

EXAM:
DIGITAL DIAGNOSTIC BILATERAL MAMMOGRAM WITH CAD AND TOMO
ULTRASOUND RIGHT BREAST

[Series 1: us breast*right* limited inc axilla · 0.06mm/px · 3 of 3 slices shown]
[im 1/3]
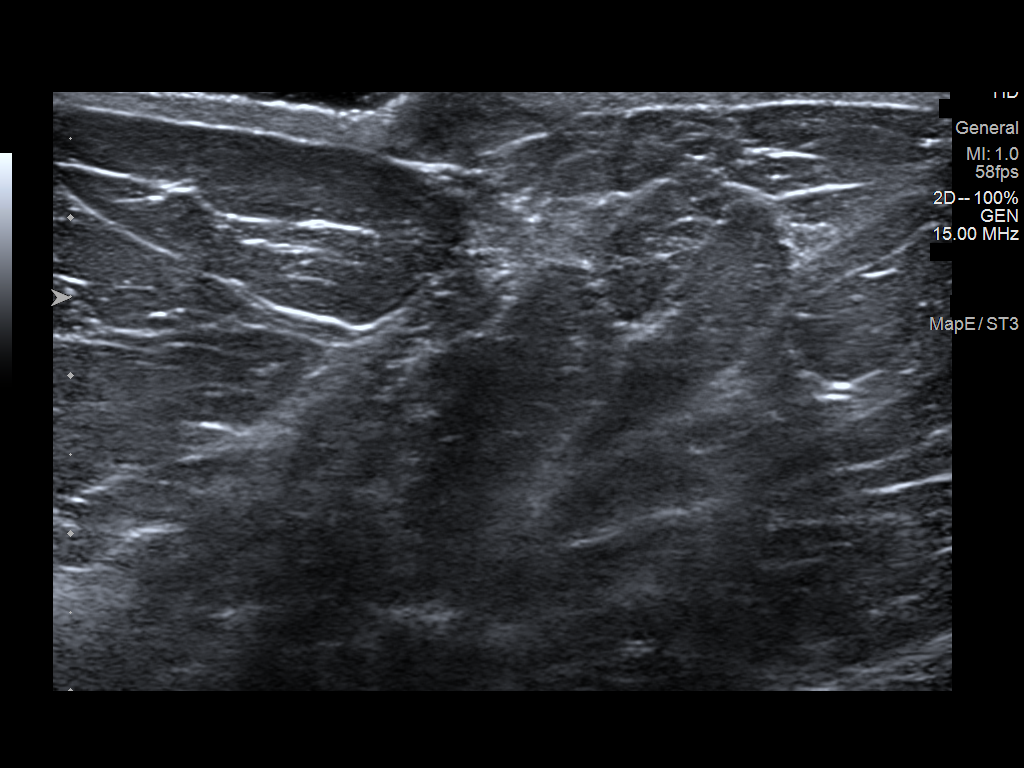
[im 2/3]
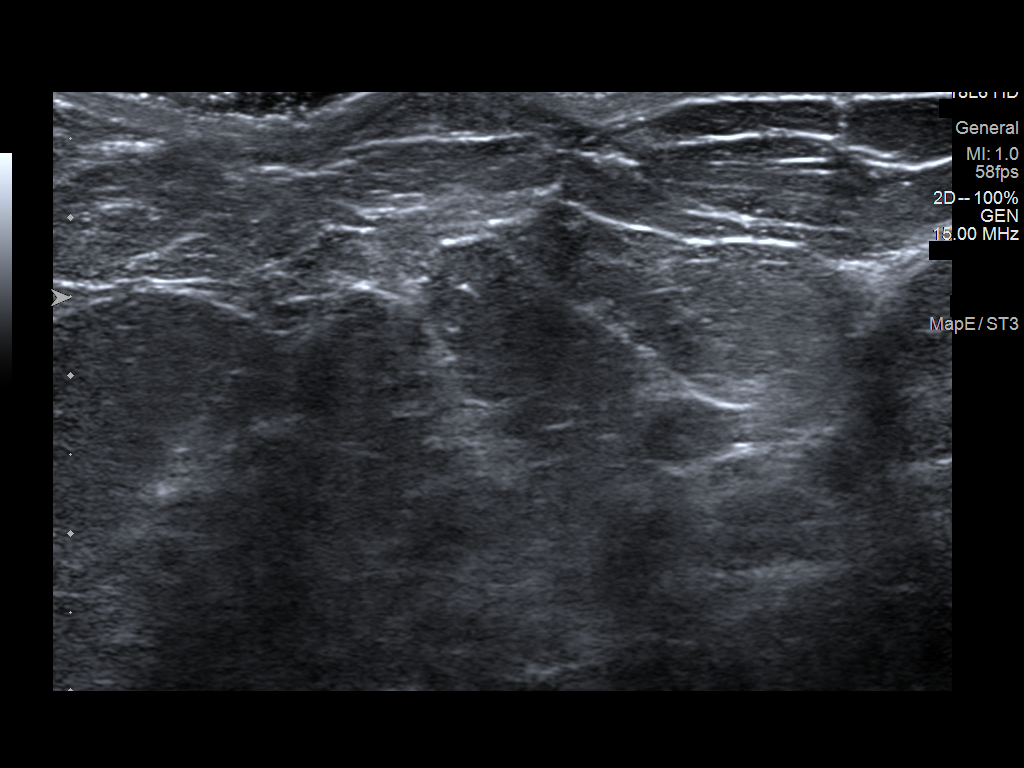
[im 3/3]
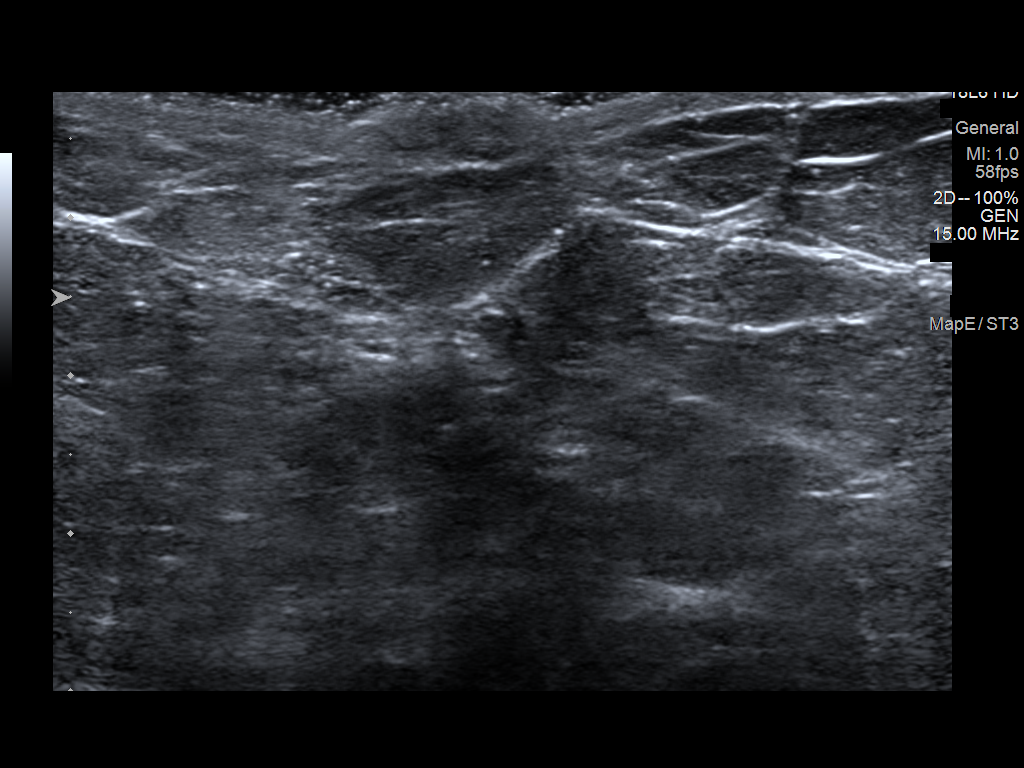

[3 of 3 positions shown; findings below may reference images not displayed]

FINDINGS: No concerning masses, calcifications or distortion identified within
either breast.

Mammographic images were processed with CAD.

On physical exam, no discharge is able to be elicited with
palpation.

Targeted ultrasound is performed, showing normal retroareolar
tissues without dilated ducts or intraductal mass.
IMPRESSION: 1. No mammographic evidence for malignancy.
2. Given the clinical history, this likely represents physiologic
discharge that is improving/resolving.

RECOMMENDATION:
1. I discussed the various causes of nipple discharge with the
patient and discussed that the next step for evaluation should the
discharge persist would include bilateral breast MRI and surgical
consultation. As there were only 2 episodes of discharge, during the
acute phase of the right breast tenderness, the patient desires to
wait and see if the discharge has resolved or persists. I instructed
the patient to call our office if the discharge occurs again so she
can be scheduled for the bilateral breast MRI and surgical
consultation.
2.  Screening mammogram in one year.(Code:[id number])

I have discussed the findings and recommendations with the patient.
If applicable, a reminder letter will be sent to the patient
regarding the next appointment.

BI-RADS CATEGORY  2: Benign.

## 2022-01-18 ENCOUNTER — Encounter: Payer: Self-pay | Admitting: Family Medicine

## 2022-01-18 DIAGNOSIS — E038 Other specified hypothyroidism: Secondary | ICD-10-CM

## 2022-01-24 ENCOUNTER — Other Ambulatory Visit: Payer: Self-pay

## 2022-01-24 DIAGNOSIS — E038 Other specified hypothyroidism: Secondary | ICD-10-CM

## 2022-01-24 MED ORDER — LEVOTHYROXINE SODIUM 137 MCG PO TABS: ORAL_TABLET | ORAL | 0 refills | Status: DC

## 2022-02-13 DIAGNOSIS — L309 Dermatitis, unspecified: Secondary | ICD-10-CM | POA: Diagnosis not present

## 2022-02-15 ENCOUNTER — Encounter: Payer: Self-pay | Admitting: Family Medicine

## 2022-02-15 NOTE — Telephone Encounter (Signed)
Okay to wait until annual physical.  In the short-term cut back on salt intake.

## 2022-02-22 ENCOUNTER — Encounter: Payer: Self-pay | Admitting: Family Medicine

## 2022-02-22 ENCOUNTER — Ambulatory Visit (INDEPENDENT_AMBULATORY_CARE_PROVIDER_SITE_OTHER): Payer: BC Managed Care – PPO | Admitting: Family Medicine

## 2022-02-22 VITALS — BP 155/94 | HR 91 | Resp 18 | Ht 62.0 in | Wt 282.0 lb

## 2022-02-22 DIAGNOSIS — R1011 Right upper quadrant pain: Secondary | ICD-10-CM

## 2022-02-22 DIAGNOSIS — I10 Essential (primary) hypertension: Secondary | ICD-10-CM | POA: Diagnosis not present

## 2022-02-22 DIAGNOSIS — R7309 Other abnormal glucose: Secondary | ICD-10-CM

## 2022-02-22 DIAGNOSIS — E038 Other specified hypothyroidism: Secondary | ICD-10-CM | POA: Diagnosis not present

## 2022-02-22 DIAGNOSIS — R10822 Left upper quadrant rebound abdominal tenderness: Secondary | ICD-10-CM | POA: Diagnosis not present

## 2022-02-22 DIAGNOSIS — Z7689 Persons encountering health services in other specified circumstances: Secondary | ICD-10-CM | POA: Insufficient documentation

## 2022-02-22 LAB — POCT GLYCOSYLATED HEMOGLOBIN (HGB A1C): Hemoglobin A1C: 5.4 % (ref 4.0–5.6)

## 2022-02-22 MED ORDER — HYDROCHLOROTHIAZIDE 12.5 MG PO TABS: 12.5000 mg | ORAL_TABLET | Freq: Every day | ORAL | 0 refills | Status: DC

## 2022-02-22 NOTE — Progress Notes (Deleted)
Complete physical exam  Patient: Desiree Jackson   DOB: 1980-09-21   41 y.o. Female  MRN: [id number]  Subjective:    No chief complaint on file.   Desiree Jackson is a 41 y.o. female who presents today for a complete physical exam. She reports consuming a {diet types:17450} diet. {types:19826} She generally feels {DESC; WELL/FAIRLY WELL/POORLY:18703}. She reports sleeping {DESC; WELL/FAIRLY WELL/POORLY:18703}. She {does/does not:200015} have additional problems to discuss today.    Most recent fall risk assessment:    03/24/2020    3:29 PM  [place] in the past year? 0  Number falls in past yr: 0  Injury with Fall? 0  Follow up Falls evaluation completed     Most recent depression screenings:    03/24/2020    3:29 PM 11/27/2018    1:10 PM  PHQ 2/9 Scores  PHQ - 2 Score 2 0  PHQ- 9 Score 4     {VISON DENTAL STD PSA (Optional):27386}  {History (Optional):23778}  Patient Care Team: Hali Marry, MD as PCP - General (Family Medicine)   Outpatient Medications Prior to Visit  Medication Sig   AMBULATORY NON FORMULARY MEDICATION Medication Name: Needs CPAP supplies including the tubing.  She already has the machine.  Previous study in 2016.   benzonatate (TESSALON) 100 MG capsule Take 1 capsule (100 mg total) by mouth 3 (three) times daily as needed for cough.   clobetasol cream (TEMOVATE) 0.05 % Apply topically.   clotrimazole-betamethasone (LOTRISONE) cream Apply 1 application topically 2 (two) times daily.   esomeprazole (NEXIUM) 40 MG capsule TAKE 1 CAPSULE BY MOUTH TWICE DAILY BEFORE A MEAL   fexofenadine (ALLEGRA ALLERGY) 180 MG tablet Take 1 tablet (180 mg total) by mouth daily for 15 days.   fluticasone (FLONASE) 50 MCG/ACT nasal spray Place 2 sprays into both nostrils daily.   levocetirizine (XYZAL) 5 MG tablet TAKE 1 TABLET(5 MG) BY MOUTH EVERY EVENING   levothyroxine (SYNTHROID) 137 MCG tablet TAKE 1 TABLET(137 MCG) BY MOUTH DAILY BEFORE  AND BREAKFAST   No facility-administered medications prior to visit.    ROS        Objective:     LMP 03/06/2013  {Vitals History (Optional):23777}  Physical Exam   No results found for any visits on 02/22/22. {Show previous labs (optional):23779}    Assessment & Plan:    Routine Health Maintenance and Physical Exam  Immunization History  Administered Date(s) Administered   Influenza Inj Mdck Quad Pf 05/30/2018   Influenza Split 06/26/2011, 06/26/2012, 05/26/2020   Influenza,inj,Quad PF,6+ Mos 06/09/2013, 04/27/2015   Influenza,inj,Quad PF,6-35 Mos 05/27/2016   Influenza,inj,quad, With Preservative 05/07/2019   PFIZER(Purple Top)SARS-COV-2 Vaccination 10/31/2019, 11/24/2019   Td 04/19/2006    Health Maintenance  Topic Date Due   Hepatitis C Screening  Never done   COVID-19 Vaccine (3 - Pfizer series) 01/19/2020   TETANUS/TDAP  08/27/2023 (Originally 04/19/2016)   INFLUENZA VACCINE  03/27/2022   HIV Screening  Completed   Pneumococcal Vaccine 35-65 Years old  Aged Out   HPV VACCINES  Aged Out    Discussed health benefits of physical activity, and encouraged her to engage in regular exercise appropriate for her age and condition.  Problem List Items Addressed This Visit   None Visit Diagnoses     Routine general medical examination at a health care facility    -  Primary      No follow-ups on file.     Beatrice Lecher, MD

## 2022-02-22 NOTE — Assessment & Plan Note (Addendum)
We discussed current prescription options available on the market that are FDA approved.  She looked several of them up on her app through her health insurance while she was here.  She is most interested in something like Mounjaro which I do think could be helpful but we discussed that it is currently only FDA approved for diabetes.  A1c today was 5.4 which is in the normal range.  We could start with something like phentermine first.  That we will need to get her blood pressure under control before starting medication.  Visit #: 1 Starting Weight: 282 lbs  Current weight: 282 lbs Previous weight: Change in weight: Goal weight: Dietary goals:has cut out sodo Exercise goals: increase activity level Medication: Follow-up and referrals:

## 2022-02-22 NOTE — Assessment & Plan Note (Signed)
Concerned about her recent weight gain since being on [place].  It is one of the only medications that is really helped her she she would really like to keep it if at all possible.

## 2022-02-22 NOTE — Assessment & Plan Note (Signed)
Due to recheck TSH.  Will make adjustment is needed.

## 2022-02-22 NOTE — Assessment & Plan Note (Addendum)
New dx. we discussed putting her on medication for now and if as we are working to reduce her weight it is improving and going away then we can discontinue the medication.  We will start low-dose HCTZ 12.5 mg daily.

## 2022-02-22 NOTE — Progress Notes (Signed)
Established Patient Office Visit  Subjective   Patient ID: Desiree Jackson, female    DOB: Jul 29, 1981  Age: 41 y.o. MRN: [id number]  Chief Complaint  Patient presents with   Hypertension    Patient stated BP has been running high for 2 weeks    Abdominal Pain    Patient complains of right side abdominal pain for 3 months.    Weight Gain    Patient stated since she has started on Dupxient she has gained a lot of weight. Patient would like to discuss options.     HPI  Right sided abdominal pain for 3 months.  She says it is mostly in the right upper quadrant.  She has had her gallbladder removed previously.  She has the pain comes and goes its not associated with any nausea.  She does feel very bloated when it happens.  She denies any known triggers such as eating etc.  Once it starts it can last hours to even a couple of days.  She does not have any change in bowel movements recently.  No blood in the stool.  She is concerned that her blood pressures are elevated.  Reports home blood pressures have been running in the 140s over 80s and at doctor's offices its been running even higher similar to today.  She says she is actually cut out sodas.  She does not feel like she eats excessive salt.  She is not currently taking any decongestants.  She has been under little bit more stress.  Hypothyroidism - Taking medication regularly in the AM away from food and vitamins, etc. No recent change to skin, hair, or energy levels.  She has been on Dupixent since December and has steadily gained weight since then.  She just spoke with her physician about it and they really do not have a lot of other options so she wanted to discuss options to help her lose weight if at all possible.  Her current insurance plan.  Also having pain in her leftt  shoulder and right outer knee and some swelling.  She has been diagnosed with bursitis in that left shoulder previously and she has the exercises to do at home  and says the pain feels very similar to what she is experiencing in her right outer knee where its been swelling as well.    ROS    Objective:     BP (!) 155/94   Pulse 91   Resp 18   Ht '5\' 2"'  (1.575 m)   Wt 282 lb (127.9 kg)   LMP 03/06/2013   SpO2 97%   BMI 51.58 kg/m    Physical Exam Vitals and nursing note reviewed.  Constitutional:      Appearance: She is well-developed.  HENT:     Head: Normocephalic and atraumatic.  Cardiovascular:     Rate and Rhythm: Normal rate and regular rhythm.     Heart sounds: Normal heart sounds.  Pulmonary:     Effort: Pulmonary effort is normal.     Breath sounds: Normal breath sounds.  Abdominal:     Tenderness: There is abdominal tenderness in the right upper quadrant and left upper quadrant. There is no guarding or rebound.  Musculoskeletal:     Comments: Some swelling lateral and just above the right knee.  Skin:    General: Skin is warm and dry.  Neurological:     Mental Status: She is alert and oriented to person, place, and time.  Psychiatric:  Behavior: Behavior normal.      Results for orders placed or performed in visit on 02/22/22  POCT HgB A1C  Result Value Ref Range   Hemoglobin A1C 5.4 4.0 - 5.6 %   HbA1c POC (<> result, manual entry)     HbA1c, POC (prediabetic range)     HbA1c, POC (controlled diabetic range)        The ASCVD Risk score (Arnett DK, et al., 2019) failed to calculate for the following reasons:   Cannot find a previous HDL lab   Cannot find a previous total cholesterol lab    Assessment & Plan:   Problem List Items Addressed This Visit       Cardiovascular and Mediastinum   Hypertension    New dx. we discussed putting her on medication for now and if as we are working to reduce her weight it is improving and going away then we can discontinue the medication.  We will start low-dose HCTZ 12.5 mg daily.      Relevant Medications   hydrochlorothiazide (HYDRODIURIL) 12.5 MG  tablet   Other Relevant Orders   TSH   CBC with Differential   CMP14+EGFR   Lipid panel     Endocrine   Hypothyroidism    Due to recheck TSH.  Will make adjustment is needed.        Relevant Orders   TSH   CBC with Differential   CMP14+EGFR   Lipid panel     Other   Morbid obesity ([place])    Concerned about her recent weight gain since being on Dupixent.  It is one of the only medications that is really helped her she she would really like to keep it if at all possible.      Encounter for weight management    We discussed current prescription options available on the market that are FDA approved.  She looked several of them up on her app through her health insurance while she was here.  She is most interested in something like Mounjaro which I do think could be helpful but we discussed that it is currently only FDA approved for diabetes.  A1c today was 5.4 which is in the normal range.  We could start with something like phentermine first.  That we will need to get her blood pressure under control before starting medication.  Visit #: 1 Starting Weight: 282 lbs  Current weight: 282 lbs Previous weight: Change in weight: Goal weight: Dietary goals:has cut out sodo Exercise goals: increase activity level Medication: Follow-up and referrals:        Other Visit Diagnoses     RUQ pain    -  Primary   Relevant Orders   TSH   CBC with Differential   CMP14+EGFR   Lipid panel   Left upper quadrant abdominal tenderness with rebound tenderness       Relevant Orders   Lipase   Abnormal glucose       Relevant Orders   POCT HgB A1C (Completed)       RUQ pain -unclear etiology at this point.  She has had her gallbladder removed.  We will check liver enzymes she is also tender on exam in the left upper quadrant so we will check lipase as well.  Also check a CBC to make sure white blood cells are elevated but this has been coming and going for 3 months at this point.   Consider further imaging with ultrasound if labs etc. are  unrevealing.  Left shoulder pain-likely recurrent bursitis.  She says she already has the exercises at home to work on  Right lateral knee pain and swelling-she says it gradually has been getting a little better.  Continue to ice and work on gentle range of motion she could even consider wearing a compression sleeve.  If not continuing to improve then please let us know  No follow-ups on file.    Beatrice Lecher, MD

## 2022-02-23 ENCOUNTER — Other Ambulatory Visit: Payer: Self-pay | Admitting: Family Medicine

## 2022-02-23 ENCOUNTER — Encounter: Payer: Self-pay | Admitting: Family Medicine

## 2022-02-23 DIAGNOSIS — R1011 Right upper quadrant pain: Secondary | ICD-10-CM

## 2022-02-23 DIAGNOSIS — Z7689 Persons encountering health services in other specified circumstances: Secondary | ICD-10-CM

## 2022-02-23 LAB — LIPID PANEL
Chol/HDL Ratio: 5.1 ratio — ABNORMAL HIGH (ref 0.0–4.4)
Cholesterol, Total: 194 mg/dL (ref 100–199)
HDL: 38 mg/dL — ABNORMAL LOW (ref >39)
LDL Chol Calc (NIH): 130 mg/dL — ABNORMAL HIGH (ref 0–99)
Triglycerides: 144 mg/dL (ref 0–149)
VLDL Cholesterol Cal: 26 mg/dL (ref 5–40)

## 2022-02-23 LAB — CBC WITH DIFFERENTIAL/PLATELET
Basophils Absolute: 0.1 10*3/uL (ref 0.0–0.2)
Basos: 1 %
EOS (ABSOLUTE): 0.2 10*3/uL (ref 0.0–0.4)
Eos: 2 %
Hematocrit: 45.4 % (ref 34.0–46.6)
Hemoglobin: 14.8 g/dL (ref 11.1–15.9)
Immature Grans (Abs): 0.1 10*3/uL (ref 0.0–0.1)
Immature Granulocytes: 1 %
Lymphocytes Absolute: 1.8 10*3/uL (ref 0.7–3.1)
Lymphs: 17 %
MCH: 27.4 pg (ref 26.6–33.0)
MCHC: 32.6 g/dL (ref 31.5–35.7)
MCV: 84 fL (ref 79–97)
Monocytes Absolute: 0.6 10*3/uL (ref 0.1–0.9)
Monocytes: 6 %
Neutrophils Absolute: 7.7 10*3/uL — ABNORMAL HIGH (ref 1.4–7.0)
Neutrophils: 73 %
Platelets: 420 10*3/uL (ref 150–450)
RBC: 5.4 x10E6/uL — ABNORMAL HIGH (ref 3.77–5.28)
RDW: 13.7 % (ref 11.7–15.4)
WBC: 10.4 10*3/uL (ref 3.4–10.8)

## 2022-02-23 LAB — CMP14+EGFR
ALT: 36 IU/L — ABNORMAL HIGH (ref 0–32)
AST: 30 IU/L (ref 0–40)
Albumin/Globulin Ratio: 1.9 (ref 1.2–2.2)
Albumin: 4.5 g/dL (ref 3.8–4.8)
Alkaline Phosphatase: 77 IU/L (ref 44–121)
BUN/Creatinine Ratio: 10 (ref 9–23)
BUN: 10 mg/dL (ref 6–24)
Bilirubin Total: 0.5 mg/dL (ref 0.0–1.2)
CO2: 26 mmol/L (ref 20–29)
Calcium: 9.7 mg/dL (ref 8.7–10.2)
Chloride: 100 mmol/L (ref 96–106)
Creatinine, Ser: 0.97 mg/dL (ref 0.57–1.00)
Globulin, Total: 2.4 g/dL (ref 1.5–4.5)
Glucose: 93 mg/dL (ref 70–99)
Potassium: 4.8 mmol/L (ref 3.5–5.2)
Sodium: 139 mmol/L (ref 134–144)
Total Protein: 6.9 g/dL (ref 6.0–8.5)
eGFR: 75 mL/min/{1.73_m2} (ref >59)

## 2022-02-23 LAB — LIPASE: Lipase: 31 U/L (ref 14–72)

## 2022-02-23 LAB — TSH: TSH: 4.11 u[IU]/mL (ref 0.450–4.500)

## 2022-02-23 MED ORDER — TIRZEPATIDE 2.5 MG/0.5ML ~~LOC~~ SOAJ: 2.5000 mg | SUBCUTANEOUS | 0 refills | Status: DC

## 2022-02-23 NOTE — Progress Notes (Signed)
Meds ordered this encounter  Medications   tirzepatide (MOUNJARO) 2.5 MG/0.5ML Pen    Sig: Inject 2.5 mg into the skin once a week. For weight loss    Dispense:  2 mL    Refill:  0

## 2022-02-23 NOTE — Progress Notes (Signed)
Hi Desiree Jackson, no anemia.  Kidney function electrolytes are normal.  The ALT liver enzyme is still mildly elevated similar to previous.  But it is steady.  So it is not more elevated than usual.  So I do not have a great explanation for your right upper quadrant abdominal pain.  We could always do an ultrasound.  That would be the next step if you are okay with moving forward with that.  LDL cholesterol is elevated at 130.  Goal is less than 100.  Continue to work on [employer] and regular exercise.  Your thyroid looks great.  No sign of pancreatitis.  I do want you to go ahead and start the blood pressure pill that I sent over to the pharmacy.  I want you to track your blood pressures over the next couple of weeks.  If your blood pressure start coming down and look great then let me know and I would like to start a prescription called phentermine to help with weight loss.  Your A1c came back in the normal range so no sign of even prediabetes.  So it is going to be difficult to justify to the insurance to use a diabetic medication for weight loss.  That we could always at least start with phentermine as long as her blood pressure is controlled and see how far we can get with that medication.  And then we can always regroup on other options.

## 2022-02-28 NOTE — Telephone Encounter (Signed)
Orders Placed This Encounter  Procedures   US Abdomen Complete    Standing Status:   Future    Standing Expiration Date:   03/01/2023    Order Specific Question:   Reason for Exam (SYMPTOM  OR DIAGNOSIS REQUIRED)    Answer:   RUQ pain    Order Specific Question:   Preferred imaging location?    Answer:   [facility]

## 2022-03-13 ENCOUNTER — Ambulatory Visit (INDEPENDENT_AMBULATORY_CARE_PROVIDER_SITE_OTHER): Payer: BC Managed Care – PPO

## 2022-03-13 ENCOUNTER — Encounter: Payer: Self-pay | Admitting: Family Medicine

## 2022-03-13 DIAGNOSIS — R1011 Right upper quadrant pain: Secondary | ICD-10-CM | POA: Diagnosis not present

## 2022-03-13 DIAGNOSIS — K760 Fatty (change of) liver, not elsewhere classified: Secondary | ICD-10-CM | POA: Diagnosis not present

## 2022-03-13 NOTE — Progress Notes (Signed)
Hi Desiree Jackson, the ultrasound did not show any mass etc.  But the liver does look a little bit enlarged as well as some fatty liver.  Typically fatty liver does not cause pain or discomfort but I would like to work-up the fact that it is a little bit more enlarged.  I like to refer you to GI.  Do you  have a preference for provider or location?

## 2022-03-16 ENCOUNTER — Telehealth: Payer: BC Managed Care – PPO | Admitting: Physician Assistant

## 2022-03-16 DIAGNOSIS — J208 Acute bronchitis due to other specified organisms: Secondary | ICD-10-CM

## 2022-03-16 DIAGNOSIS — B9689 Other specified bacterial agents as the cause of diseases classified elsewhere: Secondary | ICD-10-CM

## 2022-03-16 MED ORDER — AZITHROMYCIN 250 MG PO TABS
ORAL_TABLET | ORAL | 0 refills | Status: AC
Start: 2022-03-16 — End: 2022-03-21

## 2022-03-16 MED ORDER — BENZONATATE 100 MG PO CAPS
100.0000 mg | ORAL_CAPSULE | Freq: Three times a day (TID) | ORAL | 0 refills | Status: DC | PRN
Start: 2022-03-16 — End: 2022-10-15

## 2022-03-16 NOTE — Progress Notes (Signed)
We are sorry that you are not feeling well.  Here is how we plan to help!  Based on your presentation I believe you most likely have A cough due to bacteria.  When patients have a fever and a productive cough with a change in color or increased sputum production, we are concerned about bacterial bronchitis.  If left untreated it can progress to pneumonia.  If your symptoms do not improve with your treatment plan it is important that you contact your provider.   I have prescribed Azithromyin 250 mg: two tablets now and then one tablet daily for 4 additonal days    In addition you may use A non-prescription cough medication called Mucinex DM: take 2 tablets every 12 hours. and A prescription cough medication called Tessalon Perles 100mg. You may take 1-2 capsules every 8 hours as needed for your cough.   From your responses in the eVisit questionnaire you describe inflammation in the upper respiratory tract which is causing a significant cough.  This is commonly called Bronchitis and has four common causes:   Allergies Viral Infections Acid Reflux Bacterial Infection Allergies, viruses and acid reflux are treated by controlling symptoms or eliminating the cause. An example might be a cough caused by taking certain blood pressure medications. You stop the cough by changing the medication. Another example might be a cough caused by acid reflux. Controlling the reflux helps control the cough.  USE OF BRONCHODILATOR ("RESCUE") INHALERS: There is a risk from using your bronchodilator too frequently.  The risk is that over-reliance on a medication which only relaxes the muscles surrounding the breathing tubes can reduce the effectiveness of medications prescribed to reduce swelling and congestion of the tubes themselves.  Although you feel brief relief from the bronchodilator inhaler, your asthma may actually be worsening with the tubes becoming more swollen and filled with mucus.  This can delay other  crucial treatments, such as oral steroid medications. If you need to use a bronchodilator inhaler daily, several times per day, you should discuss this with your provider.  There are probably better treatments that could be used to keep your asthma under control.     HOME CARE Only take medications as instructed by your medical team. Complete the entire course of an antibiotic. Drink plenty of fluids and get plenty of rest. Avoid close contacts especially the very young and the elderly Cover your mouth if you cough or cough into your sleeve. Always remember to wash your hands A steam or ultrasonic humidifier can help congestion.   GET HELP RIGHT AWAY IF: You develop worsening fever. You become short of breath You cough up blood. Your symptoms persist after you have completed your treatment plan MAKE SURE YOU  Understand these instructions. Will watch your condition. Will get help right away if you are not doing well or get worse.    Thank you for choosing an e-visit.  Your e-visit answers were reviewed by a board certified advanced clinical practitioner to complete your personal care plan. Depending upon the condition, your plan could have included both over the counter or prescription medications.  Please review your pharmacy choice. Make sure the pharmacy is open so you can pick up prescription now. If there is a problem, you may contact your provider through MyChart messaging and have the prescription routed to another pharmacy.  Your safety is important to us. If you have drug allergies check your prescription carefully.   For the next 24 hours you can use   to ask questions about today's visit, request a non-urgent call back, or ask for a work or school excuse. You will get an email in the next two days asking about your experience. I hope that your e-visit has been valuable and will speed your recovery.  I provided 5 minutes of non face-to-face time during this encounter for chart  review and documentation.

## 2022-04-04 ENCOUNTER — Encounter: Payer: Self-pay | Admitting: Family Medicine

## 2022-04-04 DIAGNOSIS — K760 Fatty (change of) liver, not elsewhere classified: Secondary | ICD-10-CM

## 2022-04-04 DIAGNOSIS — R160 Hepatomegaly, not elsewhere classified: Secondary | ICD-10-CM

## 2022-04-04 NOTE — Telephone Encounter (Signed)
Orders Placed This Encounter  Procedures   Ambulatory referral to Gastroenterology    Referral Priority:   Routine    Referral Type:   Consultation    Referral Reason:   Specialty Services Required    Referred to Provider:   Lavena Bullion, DO    Number of Visits Requested:   1    I do see complex kiddos and can help coordinate care with other specialists if they have those as well.

## 2022-04-30 DIAGNOSIS — R109 Unspecified abdominal pain: Secondary | ICD-10-CM | POA: Diagnosis not present

## 2022-04-30 DIAGNOSIS — I700 Atherosclerosis of aorta: Secondary | ICD-10-CM | POA: Diagnosis not present

## 2022-04-30 DIAGNOSIS — R1013 Epigastric pain: Secondary | ICD-10-CM | POA: Diagnosis not present

## 2022-05-01 ENCOUNTER — Encounter: Payer: BC Managed Care – PPO | Admitting: Family Medicine

## 2022-05-08 ENCOUNTER — Encounter: Payer: BC Managed Care – PPO | Admitting: Family Medicine

## 2022-05-15 ENCOUNTER — Encounter: Payer: BC Managed Care – PPO | Admitting: Family Medicine

## 2022-05-23 DIAGNOSIS — E039 Hypothyroidism, unspecified: Secondary | ICD-10-CM | POA: Diagnosis not present

## 2022-05-23 DIAGNOSIS — Z6841 Body Mass Index (BMI) 40.0 and over, adult: Secondary | ICD-10-CM | POA: Diagnosis not present

## 2022-05-23 DIAGNOSIS — K219 Gastro-esophageal reflux disease without esophagitis: Secondary | ICD-10-CM | POA: Diagnosis not present

## 2022-05-28 DIAGNOSIS — Z713 Dietary counseling and surveillance: Secondary | ICD-10-CM | POA: Diagnosis not present

## 2022-05-30 DIAGNOSIS — Z7182 Exercise counseling: Secondary | ICD-10-CM | POA: Diagnosis not present

## 2022-05-30 DIAGNOSIS — R635 Abnormal weight gain: Secondary | ICD-10-CM | POA: Diagnosis not present

## 2022-05-30 DIAGNOSIS — Z6841 Body Mass Index (BMI) 40.0 and over, adult: Secondary | ICD-10-CM | POA: Diagnosis not present

## 2022-06-03 DIAGNOSIS — Z6841 Body Mass Index (BMI) 40.0 and over, adult: Secondary | ICD-10-CM | POA: Diagnosis not present

## 2022-06-04 DIAGNOSIS — Z1231 Encounter for screening mammogram for malignant neoplasm of breast: Secondary | ICD-10-CM | POA: Diagnosis not present

## 2022-06-04 LAB — HM MAMMOGRAPHY

## 2022-06-12 DIAGNOSIS — F419 Anxiety disorder, unspecified: Secondary | ICD-10-CM | POA: Diagnosis not present

## 2022-06-14 DIAGNOSIS — Z713 Dietary counseling and surveillance: Secondary | ICD-10-CM | POA: Diagnosis not present

## 2022-06-22 ENCOUNTER — Ambulatory Visit
Admission: EM | Admit: 2022-06-22 | Discharge: 2022-06-22 | Disposition: A | Discharge status: Home / Self Care | Payer: BC Managed Care – PPO | Attending: Family Medicine | Admitting: Family Medicine

## 2022-06-22 ENCOUNTER — Encounter: Payer: Self-pay | Admitting: Emergency Medicine

## 2022-06-22 DIAGNOSIS — H6123 Impacted cerumen, bilateral: Secondary | ICD-10-CM

## 2022-06-22 DIAGNOSIS — H9201 Otalgia, right ear: Secondary | ICD-10-CM | POA: Diagnosis not present

## 2022-06-22 MED ORDER — AZITHROMYCIN 250 MG PO TABS: ORAL_TABLET | ORAL | 0 refills | Status: DC

## 2022-06-22 NOTE — Discharge Instructions (Addendum)
May take Tylenol as needed for pain.  If symptoms become significantly worse during the night or over the weekend, proceed to the local emergency room.
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
## 2022-06-22 NOTE — ED Triage Notes (Signed)
Patient c/o right ear pain that radiates down into her neck x 2-3 days.  Patient has taken Tylenol and Ibuprofen for pain.

## 2022-06-23 ENCOUNTER — Telehealth: Payer: Self-pay | Admitting: Emergency Medicine

## 2022-06-23 NOTE — Telephone Encounter (Signed)
Call by this RN to see if pt had any concerns or questions about visit yesterday - none at this time.

## 2022-06-27 DIAGNOSIS — F419 Anxiety disorder, unspecified: Secondary | ICD-10-CM | POA: Diagnosis not present

## 2022-06-29 DIAGNOSIS — K760 Fatty (change of) liver, not elsewhere classified: Secondary | ICD-10-CM | POA: Diagnosis not present

## 2022-06-29 DIAGNOSIS — R1011 Right upper quadrant pain: Secondary | ICD-10-CM | POA: Diagnosis not present

## 2022-06-29 DIAGNOSIS — R1031 Right lower quadrant pain: Secondary | ICD-10-CM | POA: Diagnosis not present

## 2022-07-02 ENCOUNTER — Encounter: Payer: Self-pay | Admitting: Family Medicine

## 2022-07-11 ENCOUNTER — Other Ambulatory Visit: Payer: Self-pay | Admitting: Family Medicine

## 2022-07-11 DIAGNOSIS — E038 Other specified hypothyroidism: Secondary | ICD-10-CM

## 2022-07-13 ENCOUNTER — Other Ambulatory Visit: Payer: Self-pay | Admitting: Family Medicine

## 2022-07-13 ENCOUNTER — Encounter: Payer: Self-pay | Admitting: Family Medicine

## 2022-07-13 DIAGNOSIS — E038 Other specified hypothyroidism: Secondary | ICD-10-CM

## 2022-07-27 DIAGNOSIS — Z713 Dietary counseling and surveillance: Secondary | ICD-10-CM | POA: Diagnosis not present

## 2022-07-27 DIAGNOSIS — Z6841 Body Mass Index (BMI) 40.0 and over, adult: Secondary | ICD-10-CM | POA: Diagnosis not present

## 2022-08-14 DIAGNOSIS — R197 Diarrhea, unspecified: Secondary | ICD-10-CM | POA: Diagnosis not present

## 2022-08-14 DIAGNOSIS — Z91018 Allergy to other foods: Secondary | ICD-10-CM | POA: Diagnosis not present

## 2022-08-14 DIAGNOSIS — Z87891 Personal history of nicotine dependence: Secondary | ICD-10-CM | POA: Diagnosis not present

## 2022-08-14 DIAGNOSIS — Z91013 Allergy to seafood: Secondary | ICD-10-CM | POA: Diagnosis not present

## 2022-08-14 DIAGNOSIS — K5730 Diverticulosis of large intestine without perforation or abscess without bleeding: Secondary | ICD-10-CM | POA: Diagnosis not present

## 2022-08-14 DIAGNOSIS — R1011 Right upper quadrant pain: Secondary | ICD-10-CM | POA: Diagnosis not present

## 2022-08-14 DIAGNOSIS — Z880 Allergy status to penicillin: Secondary | ICD-10-CM | POA: Diagnosis not present

## 2022-08-14 DIAGNOSIS — Z9049 Acquired absence of other specified parts of digestive tract: Secondary | ICD-10-CM | POA: Diagnosis not present

## 2022-08-14 DIAGNOSIS — E278 Other specified disorders of adrenal gland: Secondary | ICD-10-CM | POA: Diagnosis not present

## 2022-08-14 DIAGNOSIS — R160 Hepatomegaly, not elsewhere classified: Secondary | ICD-10-CM | POA: Diagnosis not present

## 2022-08-30 DIAGNOSIS — K2950 Unspecified chronic gastritis without bleeding: Secondary | ICD-10-CM | POA: Diagnosis not present

## 2022-08-30 DIAGNOSIS — K5909 Other constipation: Secondary | ICD-10-CM | POA: Diagnosis not present

## 2022-08-30 DIAGNOSIS — K5730 Diverticulosis of large intestine without perforation or abscess without bleeding: Secondary | ICD-10-CM | POA: Diagnosis not present

## 2022-08-30 DIAGNOSIS — K317 Polyp of stomach and duodenum: Secondary | ICD-10-CM | POA: Diagnosis not present

## 2022-08-30 DIAGNOSIS — Z6841 Body Mass Index (BMI) 40.0 and over, adult: Secondary | ICD-10-CM | POA: Diagnosis not present

## 2022-08-30 DIAGNOSIS — R1011 Right upper quadrant pain: Secondary | ICD-10-CM | POA: Diagnosis not present

## 2022-08-30 DIAGNOSIS — R1031 Right lower quadrant pain: Secondary | ICD-10-CM | POA: Diagnosis not present

## 2022-08-30 DIAGNOSIS — K760 Fatty (change of) liver, not elsewhere classified: Secondary | ICD-10-CM | POA: Diagnosis not present

## 2022-08-30 DIAGNOSIS — K648 Other hemorrhoids: Secondary | ICD-10-CM | POA: Diagnosis not present

## 2022-09-13 ENCOUNTER — Telehealth (INDEPENDENT_AMBULATORY_CARE_PROVIDER_SITE_OTHER): Payer: BC Managed Care – PPO | Admitting: Family Medicine

## 2022-09-13 DIAGNOSIS — R1011 Right upper quadrant pain: Secondary | ICD-10-CM | POA: Diagnosis not present

## 2022-09-13 DIAGNOSIS — M546 Pain in thoracic spine: Secondary | ICD-10-CM

## 2022-09-13 DIAGNOSIS — G8929 Other chronic pain: Secondary | ICD-10-CM

## 2022-09-13 NOTE — Progress Notes (Signed)
Virtual Visit via Video Note  I connected with Desiree Jackson on 09/14/22 at 10:50 AM EST by a video enabled telemedicine application and verified that I am speaking with the correct person using two identifiers.   I discussed the limitations of evaluation and management by telemedicine and the availability of in person appointments. The patient expressed understanding and agreed to proceed.  Patient location: at home Provider location: in office  Subjective:    CC:  No chief complaint on file.   HPI: She would like to discuss the right upper quadrant pain that she is still having intermittently.  She originally saw me for this concern back in June.  At that point she had already been having some right-sided abdominal pain for almost 3 months intermittently.  Prior cholecystectomy.  It is not associated with any nausea no food triggers.  No change in bowels or stools.  We did an ultrasound which did show fatty liver and referred her to GI.  She was not able to get in with GI until November and then they decided to schedule her for upper and lower endoscopy which was done on January 2.  She had 1 polyp which in her stomach which came back benign.  She was told to take MiraLAX daily but is not really having any constipation issues.  He says she is really been working hard on cleaning up her diet she is really limited salt and sugary drinks.  She says her weight is down and her blood pressure is much better.  But she still having episodes of pain.  When it happens it sometimes will radiate around to her back.  This sensation in her back feels like a pressure.  It has been severe enough twice that she actually went to the emergency room.  CT Abdomen Pelvis W IV Contrast  Anatomical Region Laterality Modality  Abdomen -- Computed Tomography  Pelvis -- --  Hip -- --   Impression  IMPRESSION: 1.  Incompletely evaluated left adrenal nodule. Recommend further evaluation with MRI  abdomen with and without contrast with adrenal protocol. 2.  Suggestion of rectocele. 3.  Hepatomegaly. 4.  Nonspecific interaortocaval node, probably reactive.   Past medical history, Surgical history, Family history not pertinant except as noted below, Social history, Allergies, and medications have been entered into the medical record, reviewed, and corrections made.    Objective:    General: Speaking clearly in complete sentences without any shortness of breath.  Alert and oriented x3.  Normal judgment. No apparent acute distress.    Impression and Recommendations:    Problem List Items Addressed This Visit   None Visit Diagnoses     RUQ pain    -  Primary   Relevant Orders   DG Thoracic Spine W/Swimmers   Chronic right-sided thoracic back pain       Relevant Orders   DG Thoracic Spine W/Swimmers      Right upper quadrant pain that radiates around to her back.  She has had some GI workup including upper and lower and endoscopy.  No specific findings to explain her pain.  Prior history of cholecystectomy.  She does have a fatty liver but this does not usually cause discomfort and pain.  We discussed that this could actually be coming from her back.  And recommend further workup starting with plain film x-rays of the thoracic spine.  They did note some degenerative changes in the spine on the CT that she had  but again it was not dedicated to the spine.  So if we do need further evaluation we will need to order an MRI.   Orders Placed This Encounter  Procedures   DG Thoracic Spine W/Swimmers    Standing Status:   Future    Standing Expiration Date:   09/14/2023    Order Specific Question:   Reason for Exam (SYMPTOM  OR DIAGNOSIS REQUIRED)    Answer:   right sided back pain    Order Specific Question:   Is patient pregnant?    Answer:   No    Order Specific Question:   Preferred imaging location?    Answer:   [facility]    No orders of the defined types were  placed in this encounter.    I discussed the assessment and treatment plan with the patient. The patient was provided an opportunity to ask questions and all were answered. The patient agreed with the plan and demonstrated an understanding of the instructions.   The patient was advised to call back or seek an in-person evaluation if the symptoms worsen or if the condition fails to improve as anticipated.   Beatrice Lecher, MD

## 2022-09-14 ENCOUNTER — Encounter: Payer: Self-pay | Admitting: Family Medicine

## 2022-09-14 ENCOUNTER — Ambulatory Visit (INDEPENDENT_AMBULATORY_CARE_PROVIDER_SITE_OTHER): Payer: BC Managed Care – PPO

## 2022-09-14 DIAGNOSIS — R1011 Right upper quadrant pain: Secondary | ICD-10-CM

## 2022-09-14 DIAGNOSIS — M546 Pain in thoracic spine: Secondary | ICD-10-CM

## 2022-09-14 DIAGNOSIS — M549 Dorsalgia, unspecified: Secondary | ICD-10-CM | POA: Diagnosis not present

## 2022-09-14 DIAGNOSIS — G8929 Other chronic pain: Secondary | ICD-10-CM | POA: Diagnosis not present

## 2022-09-17 ENCOUNTER — Encounter: Payer: Self-pay | Admitting: Family Medicine

## 2022-09-17 DIAGNOSIS — G8929 Other chronic pain: Secondary | ICD-10-CM

## 2022-09-17 NOTE — Progress Notes (Signed)
Hi Desiree Jackson, x-ray looks good and reassuring.  Are you wanting to move forward with more advanced imaging such as MRI or consider something like physical therapy first.?

## 2022-09-30 ENCOUNTER — Other Ambulatory Visit: Payer: BC Managed Care – PPO

## 2022-10-03 ENCOUNTER — Encounter: Payer: Self-pay | Admitting: Family Medicine

## 2022-10-03 ENCOUNTER — Other Ambulatory Visit: Payer: Self-pay | Admitting: Family Medicine

## 2022-10-03 DIAGNOSIS — E038 Other specified hypothyroidism: Secondary | ICD-10-CM

## 2022-10-03 MED ORDER — METAXALONE 800 MG PO TABS: 800.0000 mg | ORAL_TABLET | Freq: Three times a day (TID) | ORAL | 1 refills | Status: DC | PRN

## 2022-10-03 NOTE — Telephone Encounter (Signed)
Muscle relaxer sent.   Meds ordered this encounter  Medications   metaxalone (SKELAXIN) 800 MG tablet    Sig: Take 1 tablet (800 mg total) by mouth 3 (three) times daily as needed for muscle spasms.    Dispense:  45 tablet    Refill:  1

## 2022-10-07 ENCOUNTER — Ambulatory Visit (INDEPENDENT_AMBULATORY_CARE_PROVIDER_SITE_OTHER): Payer: BC Managed Care – PPO

## 2022-10-07 DIAGNOSIS — M546 Pain in thoracic spine: Secondary | ICD-10-CM | POA: Diagnosis not present

## 2022-10-07 DIAGNOSIS — G8929 Other chronic pain: Secondary | ICD-10-CM | POA: Diagnosis not present

## 2022-10-07 DIAGNOSIS — M549 Dorsalgia, unspecified: Secondary | ICD-10-CM | POA: Diagnosis not present

## 2022-10-09 ENCOUNTER — Other Ambulatory Visit: Payer: Self-pay | Admitting: *Deleted

## 2022-10-09 ENCOUNTER — Encounter: Payer: Self-pay | Admitting: Family Medicine

## 2022-10-09 DIAGNOSIS — R937 Abnormal findings on diagnostic imaging of other parts of musculoskeletal system: Secondary | ICD-10-CM

## 2022-10-09 DIAGNOSIS — G8929 Other chronic pain: Secondary | ICD-10-CM

## 2022-10-09 DIAGNOSIS — M5134 Other intervertebral disc degeneration, thoracic region: Secondary | ICD-10-CM

## 2022-10-09 NOTE — Telephone Encounter (Signed)
Orders Placed This Encounter  Procedures   Ambulatory referral to Neurosurgery    Referral Priority:   Routine    Referral Type:   Surgical    Referral Reason:   Specialty Services Required    Requested Specialty:   Neurosurgery    Number of Visits Requested:   1

## 2022-10-09 NOTE — Progress Notes (Signed)
Sorry, in addition to the note below the disc is also pressing on the thecal sac on the right side at T12-L1 which is closer to your low back.  So I want them to take a look at that area as well.

## 2022-10-09 NOTE — Progress Notes (Signed)
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  Out of these disc herniations some are fairly small but the one that is more concerning is at T3-4.  It is a moderate disc herniation that is actually pressing right up against the sac of the spinal cord.  I would like to consider getting you in with a neurosurgeon for further discussion and evaluation.  I do not know that this necessarily needs surgery but I just think that they would be the right experts to take a look at this and to discuss your pain with and see if they think you would benefit from treatment.  If you are okay with this then please let me know and I can place referral.

## 2022-10-16 ENCOUNTER — Telehealth: Payer: Self-pay | Admitting: *Deleted

## 2022-10-16 MED ORDER — DULOXETINE HCL 30 MG PO CPEP: 30.0000 mg | ORAL_CAPSULE | Freq: Every day | ORAL | 1 refills | Status: DC

## 2022-10-16 NOTE — Telephone Encounter (Signed)
Called  [facility] and was told that Dr. Christella Noa was late getting to the [facility] office this morning. Was not informed whether or not patient was aware of how late he was going to be getting there.

## 2022-10-16 NOTE — Addendum Note (Signed)
Addended by: Beatrice Lecher D on: 10/16/2022 06:30 PM   Modules accepted: Orders

## 2022-10-16 NOTE — Telephone Encounter (Signed)
Orders Placed This Encounter  Procedures   Ambulatory referral to Neurosurgery    Referral Priority:   Routine    Referral Type:   Surgical    Referral Reason:   Specialty Services Required    Requested Specialty:   Neurosurgery    Number of Visits Requested:   1

## 2022-10-16 NOTE — Telephone Encounter (Signed)
Can you please place another Neurosurgery referral? Patient had an appointment with Dr. Parks Neptune and is requesting referral to be sent to [facility]. Please advise.

## 2022-10-16 NOTE — Addendum Note (Signed)
Addended by: Beatrice Lecher D on: 10/16/2022 06:23 PM   Modules accepted: Orders

## 2022-10-16 NOTE — Telephone Encounter (Signed)
Called pt and LVM asking that she rtn call to discuss what happened at her appointment today.   Also informed her that we will place another referral for neuro to [facility].

## 2022-10-17 ENCOUNTER — Telehealth: Payer: Self-pay

## 2022-10-17 ENCOUNTER — Other Ambulatory Visit: Payer: Self-pay | Admitting: *Deleted

## 2022-10-17 DIAGNOSIS — R160 Hepatomegaly, not elsewhere classified: Secondary | ICD-10-CM

## 2022-10-17 DIAGNOSIS — K760 Fatty (change of) liver, not elsewhere classified: Secondary | ICD-10-CM

## 2022-10-17 NOTE — Telephone Encounter (Signed)
Prior auth required for [employer]. Thanks in advance.

## 2022-10-17 NOTE — Telephone Encounter (Signed)
Initiated Prior authorization [id number] 2.5MG/0.5ML pen-injectors  Via: Covermymeds Case/Key:[id number] Status: approved  as of 10/17/22 Reason:Coverage Start Date:09/17/2022;Coverage End Date:10/17/2023; Notified Pt via: Mychart

## 2022-10-29 DIAGNOSIS — H3552 Pigmentary retinal dystrophy: Secondary | ICD-10-CM | POA: Diagnosis not present

## 2022-11-08 ENCOUNTER — Encounter: Payer: Self-pay | Admitting: Family Medicine

## 2022-11-08 MED ORDER — TIRZEPATIDE 5 MG/0.5ML ~~LOC~~ SOAJ: 5.0000 mg | SUBCUTANEOUS | 0 refills | Status: DC

## 2022-11-08 NOTE — Telephone Encounter (Signed)
Meds ordered this encounter  Medications   tirzepatide (MOUNJARO) 5 MG/0.5ML Pen    Sig: Inject 5 mg into the skin once a week.    Dispense:  6 mL    Refill:  0

## 2022-12-03 DIAGNOSIS — M47814 Spondylosis without myelopathy or radiculopathy, thoracic region: Secondary | ICD-10-CM | POA: Diagnosis not present

## 2022-12-06 DIAGNOSIS — K760 Fatty (change of) liver, not elsewhere classified: Secondary | ICD-10-CM | POA: Diagnosis not present

## 2022-12-12 ENCOUNTER — Encounter: Payer: Self-pay | Admitting: Family Medicine

## 2022-12-12 DIAGNOSIS — J0140 Acute pansinusitis, unspecified: Secondary | ICD-10-CM

## 2022-12-13 ENCOUNTER — Other Ambulatory Visit: Payer: Self-pay

## 2022-12-13 DIAGNOSIS — J0140 Acute pansinusitis, unspecified: Secondary | ICD-10-CM

## 2022-12-13 MED ORDER — ESOMEPRAZOLE MAGNESIUM 40 MG PO CPDR: DELAYED_RELEASE_CAPSULE | ORAL | 0 refills | Status: DC

## 2022-12-13 MED ORDER — LEVOCETIRIZINE DIHYDROCHLORIDE 5 MG PO TABS
5.0000 mg | ORAL_TABLET | Freq: Every evening | ORAL | 0 refills | Status: DC
Start: 2022-12-13 — End: 2023-06-05

## 2022-12-13 MED ORDER — LEVOCETIRIZINE DIHYDROCHLORIDE 5 MG PO TABS
5.0000 mg | ORAL_TABLET | Freq: Every evening | ORAL | 0 refills | Status: DC
Start: 2022-12-13 — End: 2022-12-13

## 2022-12-26 ENCOUNTER — Encounter: Payer: Self-pay | Admitting: Family Medicine

## 2022-12-26 ENCOUNTER — Telehealth (INDEPENDENT_AMBULATORY_CARE_PROVIDER_SITE_OTHER): Payer: BC Managed Care – PPO | Admitting: Family Medicine

## 2022-12-26 VITALS — BP 119/62 | Wt 240.0 lb

## 2022-12-26 DIAGNOSIS — Z713 Dietary counseling and surveillance: Secondary | ICD-10-CM

## 2022-12-26 DIAGNOSIS — Z7689 Persons encountering health services in other specified circumstances: Secondary | ICD-10-CM

## 2022-12-26 MED ORDER — TIRZEPATIDE 7.5 MG/0.5ML ~~LOC~~ SOAJ
7.5000 mg | SUBCUTANEOUS | 0 refills | Status: DC
Start: 2022-12-26 — End: 2023-01-15

## 2022-12-26 NOTE — Progress Notes (Signed)
Pt stated that she is ready to go to next dose of Mounjaro.

## 2022-12-26 NOTE — Assessment & Plan Note (Signed)
Visit #: 2 Starting Weight: 282 lbs   Current weight: 240 lbs Previous weight:  Change in weight: Goal weight: Dietary goals:has cut out sodo Exercise goals: increase activity level Medication: Mounjaro 7.5 mg  Follow-up and referrals: 6 weeks.

## 2022-12-26 NOTE — Progress Notes (Signed)
    Virtual Visit via Video Note  I connected with Desiree Jackson on 12/26/22 at 10:50 AM EDT by a video enabled telemedicine application and verified that I am speaking with the correct person using two identifiers.   I discussed the limitations of evaluation and management by telemedicine and the availability of in person appointments. The patient expressed understanding and agreed to proceed.  Patient location: at home Provider location: in office  Subjective:    CC:  No chief complaint on file.   HPI: Doing well with Mounjaro 5mg . Has noticed a bump in appetite in the last couple of weeks.  Has been on this dose for about 6 weeks.  No nausea.  Still working on [employer] and doing well with proteins. Has been exercising regularly as well. Down to 240Lb on her home scale. Would like to go up on the dose.      Past medical history, Surgical history, Family history not pertinant except as noted below, Social history, Allergies, and medications have been entered into the medical record, reviewed, and corrections made.    Objective:    General: Speaking clearly in complete sentences without any shortness of breath.  Alert and oriented x3.  Normal judgment. No apparent acute distress.    Impression and Recommendations:    Problem List Items Addressed This Visit       Other   Encounter for weight management - Primary    Visit #: 2 Starting Weight: 282 lbs   Current weight: 240 lbs Previous weight:  Change in weight: Goal weight: Dietary goals:has cut out sodo Exercise goals: increase activity level Medication: Mounjaro 7.5 mg  Follow-up and referrals: 6 weeks.       Relevant Medications   tirzepatide (MOUNJARO) 7.5 MG/0.5ML Pen    No orders of the defined types were placed in this encounter.   Meds ordered this encounter  Medications   tirzepatide (MOUNJARO) 7.5 MG/0.5ML Pen    Sig: Inject 7.5 mg into the skin once a week.    Dispense:  2 mL    Refill:   0     I discussed the assessment and treatment plan with the patient. The patient was provided an opportunity to ask questions and all were answered. The patient agreed with the plan and demonstrated an understanding of the instructions.   The patient was advised to call back or seek an in-person evaluation if the symptoms worsen or if the condition fails to improve as anticipated.  Nani Gasser, MD

## 2022-12-31 DIAGNOSIS — H40053 Ocular hypertension, bilateral: Secondary | ICD-10-CM | POA: Diagnosis not present

## 2023-01-02 ENCOUNTER — Other Ambulatory Visit: Payer: Self-pay | Admitting: Family Medicine

## 2023-01-02 DIAGNOSIS — E038 Other specified hypothyroidism: Secondary | ICD-10-CM

## 2023-01-03 ENCOUNTER — Encounter: Payer: Self-pay | Admitting: Family Medicine

## 2023-01-15 ENCOUNTER — Encounter: Payer: Self-pay | Admitting: Family Medicine

## 2023-01-15 ENCOUNTER — Ambulatory Visit: Payer: BC Managed Care – PPO | Admitting: Family Medicine

## 2023-01-15 VITALS — BP 120/71 | HR 74 | Ht 62.0 in | Wt 240.0 lb

## 2023-01-15 DIAGNOSIS — Z862 Personal history of diseases of the blood and blood-forming organs and certain disorders involving the immune mechanism: Secondary | ICD-10-CM | POA: Diagnosis not present

## 2023-01-15 DIAGNOSIS — E038 Other specified hypothyroidism: Secondary | ICD-10-CM

## 2023-01-15 DIAGNOSIS — I10 Essential (primary) hypertension: Secondary | ICD-10-CM | POA: Diagnosis not present

## 2023-01-15 DIAGNOSIS — R208 Other disturbances of skin sensation: Secondary | ICD-10-CM | POA: Diagnosis not present

## 2023-01-15 DIAGNOSIS — Z7689 Persons encountering health services in other specified circumstances: Secondary | ICD-10-CM

## 2023-01-15 MED ORDER — TIRZEPATIDE 7.5 MG/0.5ML ~~LOC~~ SOAJ: 7.5000 mg | SUBCUTANEOUS | 0 refills | Status: DC

## 2023-01-15 NOTE — Assessment & Plan Note (Signed)
Due to recheck TSH.  Recent burning in feet.

## 2023-01-15 NOTE — Assessment & Plan Note (Signed)
Visit #: 3 Starting Weight: 282 lbs   Current weight: 240 lbs Previous weight: 24 0 lbs  Change in weight:  Goal weight:  Dietary goals:has cut out soda Exercise goals: increase activity level Medication: Mounjaro 7.5 mg  Follow-up and referrals: 6 weeks.

## 2023-01-15 NOTE — Assessment & Plan Note (Signed)
Well controlled. Continue current regimen. Follow up in  6 mo  

## 2023-01-15 NOTE — Progress Notes (Signed)
Established Patient Office Visit  Subjective   Patient ID: Desiree Jackson, female    DOB: [birth year]  Age: 42 y.o. MRN: [id number]  Chief Complaint  Patient presents with   Hypertension    HPI Still struggling with the intermittent pain that wraps around from her right upper back underneath that right side and underneath her right breast.  She seen GI and even saw neurosurgery.  She is wondering if it could be related to some form of arthritis.  She does have severe eczema and does get pain in some of her joints in particular her right knee and the DIP on her first finger on her left hand.  Her daughter has rheumatoid arthritis and her father had psoriatic arthritis.  She was on Dupixent for the severe eczema/psoriasis.  And it does work and its helpful but she noticed that she gained 30 pounds on it which is why she came off.  She says occasionally if her skin gets really bad she will still give herself an injection but then her weight will plateau for about 2 weeks.  She also reports she has been getting a burning sensation in her feet.  It is usually on the bottoms of both feet.  Sometimes it will go up her ankles.  Hypertension- Pt denies chest pain, SOB, dizziness, or heart palpitations.  Taking meds as directed w/o problems.  Denies medication side effects.      ROS    Objective:     BP 120/71   Pulse 74   Ht 5\' 2"  (1.575 m)   Wt 240 lb (108.9 kg)   LMP 03/06/2013   SpO2 99%   BMI 43.90 kg/m    Physical Exam Vitals and nursing note reviewed.  Constitutional:      Appearance: She is well-developed.  HENT:     Head: Normocephalic and atraumatic.  Cardiovascular:     Rate and Rhythm: Normal rate and regular rhythm.     Heart sounds: Normal heart sounds.  Pulmonary:     Effort: Pulmonary effort is normal.     Breath sounds: Normal breath sounds.  Skin:    General: Skin is warm and dry.  Neurological:     Mental Status: She is alert and oriented to person,  place, and time.  Psychiatric:        Behavior: Behavior normal.      No results found for any visits on 01/15/23.    The 10-year ASCVD risk score (Arnett DK, et al., 2019) is: 1.1%    Assessment & Plan:   Problem List Items Addressed This Visit       Cardiovascular and Mediastinum   Hypertension - Primary    Well controlled. Continue current regimen. Follow up in  83mo       Relevant Medications   tirzepatide (MOUNJARO) 7.5 MG/0.5ML Pen   Other Relevant Orders   Sedimentation rate   C-reactive protein   Cyclic citrul peptide antibody, IgG   B12   Vitamin B1   Vitamin B6   Magnesium   Fe+TIBC+Fer     Endocrine   Hypothyroidism    Due to recheck TSH.  Recent burning in feet.        Relevant Orders   TSH     Other   Encounter for weight management    Visit #: 3 Starting Weight: 282 lbs   Current weight: 240 lbs Previous weight: 24 0 lbs  Change in weight:  Goal weight:  Dietary  goals:has cut out soda Exercise goals: increase activity level Medication: Mounjaro 7.5 mg  Follow-up and referrals: 6 weeks.       Relevant Medications   tirzepatide (MOUNJARO) 7.5 MG/0.5ML Pen   Other Relevant Orders   Sedimentation rate   C-reactive protein   Cyclic citrul peptide antibody, IgG   B12   Vitamin B1   Vitamin B6   Magnesium   Fe+TIBC+Fer   Other Visit Diagnoses     Burning sensation of feet       Relevant Medications   tirzepatide (MOUNJARO) 7.5 MG/0.5ML Pen   Other Relevant Orders   Sedimentation rate   C-reactive protein   Cyclic citrul peptide antibody, IgG   B12   Vitamin B1   Vitamin B6   Magnesium   Fe+TIBC+Fer   History of anemia due to vitamin B12 deficiency       Relevant Medications   tirzepatide (MOUNJARO) 7.5 MG/0.5ML Pen   Other Relevant Orders   Sedimentation rate   C-reactive protein   Cyclic citrul peptide antibody, IgG   B12   Vitamin B1   Vitamin B6   Magnesium   Fe+TIBC+Fer       Bilateral Feet-could be early  neuropathy.  Will check for deficiencies will do some additional labs today.  If everything is normal then consider further workup with nerve conduction.  No follow-ups on file.    Nani Gasser, MD

## 2023-01-16 LAB — SEDIMENTATION RATE: Sed Rate: 6 mm/h (ref 0–20)

## 2023-01-16 LAB — IRON,TIBC AND FERRITIN PANEL: Iron: 48 ug/dL (ref 40–190)

## 2023-01-17 ENCOUNTER — Other Ambulatory Visit: Payer: Self-pay

## 2023-01-17 DIAGNOSIS — E038 Other specified hypothyroidism: Secondary | ICD-10-CM

## 2023-01-17 LAB — MAGNESIUM: Magnesium: 1.9 mg/dL (ref 1.5–2.5)

## 2023-01-17 LAB — IRON,TIBC AND FERRITIN PANEL: %SAT: 15 % (calc) — ABNORMAL LOW (ref 16–45)

## 2023-01-17 LAB — VITAMIN B12: Vitamin B-12: 307 pg/mL (ref 200–1100)

## 2023-01-17 LAB — CYCLIC CITRUL PEPTIDE ANTIBODY, IGG: Cyclic Citrullin Peptide Ab: <16 UNITS

## 2023-01-17 LAB — C-REACTIVE PROTEIN: CRP: 5.7 mg/L (ref <8.0)

## 2023-01-17 NOTE — Progress Notes (Signed)
Ersilia, ferritin stores look pretty good iron is still on the low window.  So I would continue getting some extra iron in your diet.  Your TSH is actually a little overly suppressed.  At 0.2 usually we shoot between 1 and 2.  So would recommend that we make a slight change.  I would recommend taking a half a tab of your levothyroxine 1 day a week and a whole tab the other 6 days a week.  Repeat this pattern each week and then we can recheck your level in 6 to 8 weeks.  Make sure you are still taking at least 4 hours away from any supplements or vitamins etc.  Your inflammatory markers are normal and reassuring.  B12 and magnesium look great.  Vitamins B1 and B6 are still pending.

## 2023-01-18 DIAGNOSIS — H5340 Unspecified visual field defects: Secondary | ICD-10-CM | POA: Diagnosis not present

## 2023-01-18 DIAGNOSIS — H3552 Pigmentary retinal dystrophy: Secondary | ICD-10-CM | POA: Diagnosis not present

## 2023-01-18 DIAGNOSIS — Z148 Genetic carrier of other disease: Secondary | ICD-10-CM | POA: Diagnosis not present

## 2023-01-18 DIAGNOSIS — H5362 Acquired night blindness: Secondary | ICD-10-CM | POA: Diagnosis not present

## 2023-01-18 DIAGNOSIS — H401131 Primary open-angle glaucoma, bilateral, mild stage: Secondary | ICD-10-CM | POA: Diagnosis not present

## 2023-01-18 DIAGNOSIS — H547 Unspecified visual loss: Secondary | ICD-10-CM | POA: Diagnosis not present

## 2023-01-18 LAB — VITAMIN B1: Vitamin B1 (Thiamine): 7 nmol/L — ABNORMAL LOW (ref 8–30)

## 2023-01-18 LAB — TSH: TSH: 0.29 mIU/L — ABNORMAL LOW

## 2023-01-18 LAB — IRON,TIBC AND FERRITIN PANEL
Ferritin: 52 ng/mL (ref 16–232)
TIBC: 318 mcg/dL (calc) (ref 250–450)

## 2023-01-18 LAB — VITAMIN B6: Vitamin B6: 5.1 ng/mL (ref 2.1–21.7)

## 2023-01-18 NOTE — Progress Notes (Signed)
Hi Desiree Jackson, your vitamin B1 which is also known as thiamine is low.  I would recommend picking up an over-the-counter B1 supplement and starting that daily.  Will plan to recheck your level in about 8 weeks.

## 2023-01-25 ENCOUNTER — Telehealth: Payer: Self-pay

## 2023-01-25 NOTE — Telephone Encounter (Signed)
Initiated Prior authorization ZOX:[id number] 7.5MG /0.5ML pen-injectors  Via: Covermymeds Case/Key:[id number] Status: Pending as of 01/25/23 Reason: Notified Pt via: Mychart

## 2023-02-11 ENCOUNTER — Encounter: Payer: Self-pay | Admitting: Family Medicine

## 2023-02-19 DIAGNOSIS — Z713 Dietary counseling and surveillance: Secondary | ICD-10-CM | POA: Diagnosis not present

## 2023-03-20 DIAGNOSIS — H3552 Pigmentary retinal dystrophy: Secondary | ICD-10-CM | POA: Diagnosis not present

## 2023-03-26 ENCOUNTER — Other Ambulatory Visit: Payer: Self-pay | Admitting: Family Medicine

## 2023-03-26 DIAGNOSIS — E038 Other specified hypothyroidism: Secondary | ICD-10-CM

## 2023-03-29 ENCOUNTER — Encounter: Payer: Self-pay | Admitting: Family Medicine

## 2023-03-29 DIAGNOSIS — Z7689 Persons encountering health services in other specified circumstances: Secondary | ICD-10-CM

## 2023-03-29 MED ORDER — TIRZEPATIDE 10 MG/0.5ML ~~LOC~~ SOAJ
10.0000 mg | SUBCUTANEOUS | 0 refills | Status: DC
Start: 2023-03-29 — End: 2023-06-05

## 2023-03-29 NOTE — Telephone Encounter (Signed)
Please see labs note in regard to checking TSH.   Meds ordered this encounter  Medications   tirzepatide (MOUNJARO) 10 MG/0.5ML Pen    Sig: Inject 10 mg into the skin once a week.    Dispense:  6 mL    Refill:  0

## 2023-04-30 ENCOUNTER — Emergency Department (HOSPITAL_BASED_OUTPATIENT_CLINIC_OR_DEPARTMENT_OTHER): Payer: BC Managed Care – PPO

## 2023-04-30 ENCOUNTER — Other Ambulatory Visit: Payer: Self-pay

## 2023-04-30 ENCOUNTER — Encounter (HOSPITAL_BASED_OUTPATIENT_CLINIC_OR_DEPARTMENT_OTHER): Payer: Self-pay | Admitting: Emergency Medicine

## 2023-04-30 ENCOUNTER — Emergency Department (HOSPITAL_BASED_OUTPATIENT_CLINIC_OR_DEPARTMENT_OTHER)
Admission: EM | Admit: 2023-04-30 | Discharge: 2023-04-30 | Disposition: A | Discharge status: Home / Self Care | Payer: BC Managed Care – PPO | Attending: Emergency Medicine | Admitting: Emergency Medicine

## 2023-04-30 DIAGNOSIS — K219 Gastro-esophageal reflux disease without esophagitis: Secondary | ICD-10-CM | POA: Insufficient documentation

## 2023-04-30 DIAGNOSIS — Z794 Long term (current) use of insulin: Secondary | ICD-10-CM | POA: Insufficient documentation

## 2023-04-30 DIAGNOSIS — K760 Fatty (change of) liver, not elsewhere classified: Secondary | ICD-10-CM | POA: Diagnosis not present

## 2023-04-30 DIAGNOSIS — Z79899 Other long term (current) drug therapy: Secondary | ICD-10-CM | POA: Diagnosis not present

## 2023-04-30 DIAGNOSIS — I10 Essential (primary) hypertension: Secondary | ICD-10-CM | POA: Insufficient documentation

## 2023-04-30 DIAGNOSIS — R0602 Shortness of breath: Secondary | ICD-10-CM | POA: Diagnosis not present

## 2023-04-30 DIAGNOSIS — E039 Hypothyroidism, unspecified: Secondary | ICD-10-CM | POA: Diagnosis not present

## 2023-04-30 DIAGNOSIS — R109 Unspecified abdominal pain: Secondary | ICD-10-CM | POA: Diagnosis not present

## 2023-04-30 LAB — COMPREHENSIVE METABOLIC PANEL
ALT: 21 U/L (ref 0–44)
AST: 20 U/L (ref 15–41)
Albumin: 4.6 g/dL (ref 3.5–5.0)
Alkaline Phosphatase: 55 U/L (ref 38–126)
Anion gap: 11 (ref 5–15)
BUN: 13 mg/dL (ref 6–20)
CO2: 25 mmol/L (ref 22–32)
Calcium: 9.5 mg/dL (ref 8.9–10.3)
Chloride: 102 mmol/L (ref 98–111)
Creatinine, Ser: 0.73 mg/dL (ref 0.44–1.00)
GFR, Estimated: >60 mL/min (ref >60)
Glucose, Bld: 93 mg/dL (ref 70–99)
Potassium: 3.8 mmol/L (ref 3.5–5.1)
Sodium: 138 mmol/L (ref 135–145)
Total Bilirubin: 0.7 mg/dL (ref 0.3–1.2)
Total Protein: 7.7 g/dL (ref 6.5–8.1)

## 2023-04-30 LAB — URINALYSIS, ROUTINE W REFLEX MICROSCOPIC
Bilirubin Urine: NEGATIVE
Glucose, UA: NEGATIVE mg/dL
Hgb urine dipstick: NEGATIVE
Ketones, ur: NEGATIVE mg/dL
Leukocytes,Ua: NEGATIVE
Nitrite: NEGATIVE
Protein, ur: NEGATIVE mg/dL
Specific Gravity, Urine: <=1.005 (ref 1.005–1.030)
pH: 5.5 (ref 5.0–8.0)

## 2023-04-30 LAB — CBC
HCT: 44.9 % (ref 36.0–46.0)
Hemoglobin: 15.2 g/dL — ABNORMAL HIGH (ref 12.0–15.0)
MCH: 28.9 pg (ref 26.0–34.0)
MCHC: 33.9 g/dL (ref 30.0–36.0)
MCV: 85.4 fL (ref 80.0–100.0)
Platelets: 426 10*3/uL — ABNORMAL HIGH (ref 150–400)
RBC: 5.26 MIL/uL — ABNORMAL HIGH (ref 3.87–5.11)
RDW: 13.5 % (ref 11.5–15.5)
WBC: 10.8 10*3/uL — ABNORMAL HIGH (ref 4.0–10.5)
nRBC: 0 % (ref 0.0–0.2)

## 2023-04-30 LAB — LIPASE, BLOOD: Lipase: 31 U/L (ref 11–51)

## 2023-04-30 MED ORDER — LIDOCAINE 5 % EX PTCH
1.0000 | MEDICATED_PATCH | CUTANEOUS | Status: DC
  Administered 2023-04-30: 1 via TRANSDERMAL
  Filled 2023-04-30: qty 1

## 2023-04-30 MED ORDER — IOHEXOL 300 MG/ML  SOLN
125.0000 mL | Freq: Once | INTRAMUSCULAR | Status: AC | PRN
  Administered 2023-04-30: 125 mL via INTRAVENOUS

## 2023-04-30 NOTE — ED Provider Notes (Signed)
[facility] Provider Note   CSN: [id number] Arrival date & time: 04/30/23  1845     History  Chief Complaint  Patient presents with   Flank Pain   Abdominal Pain    Desiree Jackson is a 42 y.o. female with a history of hypothyroidism, hypertension, and GERD to the ED today for flank pain.  Patient reports has been having intermittent right flank pain for the past several months that radiates around to her right upper quadrant with additional intermittent shortness of breath with deep breaths.  She denies any chest pain, fever, nausea, vomiting, dysuria, or changes to bowel habits.  She has a history of cholecystectomy and hysterectomy.  She was seen for similar symptoms December, 2023 and had imaging done at that time.  It did not show any acute findings as causing her pain.  Patient reports that the symptoms resolved on their own after period of time. She had a thoracic spine MRI several months ago which showed disc protrusion otherwise unremarkable.  No additional complaints or concerns at this time.    Home Medications Prior to Admission medications   Medication Sig Start Date End Date Taking? Authorizing Provider  clobetasol cream (TEMOVATE) 0.05 % Apply topically.    [provider]  Dupilumab 300 MG/2ML SOPN Inject into the skin. 02/13/22   [provider]  esomeprazole (NEXIUM) 40 MG capsule TAKE 1 CAPSULE BY MOUTH TWICE DAILY BEFORE A MEAL 12/13/22   Agapito Games, MD  levocetirizine (XYZAL) 5 MG tablet Take 1 tablet (5 mg total) by mouth every evening. 12/13/22   Agapito Games, MD  levothyroxine (SYNTHROID) 137 MCG tablet TAKE 1 TABLET BY MOUTH ONCE DAILY BEFORE BREAKFAST 03/27/23   Agapito Games, MD  tirzepatide [facility]) 10 MG/0.5ML Pen Inject 10 mg into the skin once a week. 03/29/23   Agapito Games, MD      Allergies    Fish allergy, Fish-derived products, Penicillins, Shellfish  allergy, Strawberry extract, Doxycycline, and Escitalopram    Review of Systems   Review of Systems  Gastrointestinal:  Positive for abdominal pain.  Genitourinary:  Positive for flank pain.  All other systems reviewed and are negative.   Physical Exam Updated Vital Signs BP 125/82 (BP Location: Left Arm)   Pulse 78   Temp 97.9 F (36.6 C) (Oral)   Resp 16   Ht 5\' 2"  (1.575 m)   Wt 108.9 kg   LMP 03/06/2013   SpO2 99%   BMI 43.91 kg/m  Physical Exam Vitals and nursing note reviewed.  Constitutional:      General: She is not in acute distress.    Appearance: Normal appearance. She is obese.  HENT:     Head: Normocephalic and atraumatic.     Mouth/Throat:     Mouth: Mucous membranes are moist.  Eyes:     Conjunctiva/sclera: Conjunctivae normal.     Pupils: Pupils are equal, round, and reactive to light.  Cardiovascular:     Rate and Rhythm: Normal rate and regular rhythm.     Pulses: Normal pulses.     Heart sounds: Normal heart sounds.  Pulmonary:     Effort: Pulmonary effort is normal.     Breath sounds: Normal breath sounds.  Abdominal:     Palpations: Abdomen is soft.     Tenderness: There is abdominal tenderness. There is no guarding or rebound.     Comments: Right upper quadrant tenderness  Musculoskeletal:  Comments: Tenderness to palpation of right flank just below the ribs that radiates to her right upper quadrant.  Strength and range of motion of upper and lower extremities intact bilaterally.  Sensation intact as well.  Skin:    General: Skin is warm and dry.     Findings: No rash.  Neurological:     General: No focal deficit present.     Mental Status: She is alert.     Sensory: No sensory deficit.     Motor: No weakness.  Psychiatric:        Mood and Affect: Mood normal.        Behavior: Behavior normal.     ED Results / Procedures / Treatments   Labs (all labs ordered are listed, but only abnormal results are displayed) Labs Reviewed   CBC - Abnormal; Notable for the following components:      Result Value   WBC 10.8 (*)    RBC 5.26 (*)    Hemoglobin 15.2 (*)    Platelets 426 (*)    All other components within normal limits  LIPASE, BLOOD  COMPREHENSIVE METABOLIC PANEL  URINALYSIS, ROUTINE W REFLEX MICROSCOPIC    EKG None  Radiology CT ABDOMEN PELVIS W CONTRAST  Result Date: 04/30/2023 CLINICAL DATA:  Abdominal and flank pain with stone suspected. Acute nonlocalized right flank pain. EXAM: CT ABDOMEN AND PELVIS WITH CONTRAST TECHNIQUE: Multidetector CT imaging of the abdomen and pelvis was performed using the standard protocol following bolus administration of intravenous contrast. RADIATION DOSE REDUCTION: This exam was performed according to the departmental dose-optimization program which includes automated exposure control, adjustment of the mA and/or kV according to patient size and/or use of iterative reconstruction technique. CONTRAST:  OMNIPAQUE IOHEXOL 300 MG/ML  SOLN COMPARISON:  04/30/2022 FINDINGS: Lower chest: Lung bases are clear. Hepatobiliary: Focal fatty infiltration adjacent to the falciform ligament in the liver. No other focal lesions. Portal veins are patent. Gallbladder is surgically absent. No bile duct dilatation. Pancreas: Unremarkable. No pancreatic ductal dilatation or surrounding inflammatory changes. Spleen: Normal in size without focal abnormality. Adrenals/Urinary Tract: 2 cm left adrenal gland nodule measuring 30 Hounsfield units on contrast enhanced examination. No change in size since previous study. The nodule is also demonstrated on prior study from 11/19/2016. Long-term stability suggests benign etiology and no imaging follow-up is indicated. Kidneys are symmetrical. Nephrograms are homogeneous. Evaluation for stones is limited after contrast administration but no definite stones are indicated. No hydronephrosis or hydroureter. Bladder is unremarkable. Stomach/Bowel: Stomach is within  normal limits. Appendix appears normal. No evidence of bowel wall thickening, distention, or inflammatory changes. Vascular/Lymphatic: Aortic atherosclerosis. No enlarged abdominal or pelvic lymph nodes. Reproductive: Status post hysterectomy. No adnexal masses. Other: No abdominal wall hernia or abnormality. No abdominopelvic ascites. Musculoskeletal: No acute or significant osseous findings. IMPRESSION: 1. Focal fatty infiltration of the liver. 2. No evidence of ureteral stone or obstruction. 3. Mild aortic atherosclerosis. Electronically Signed   By: Burman Nieves M.D.   On: 04/30/2023 23:04    Procedures Procedures: not indicated.   Medications Ordered in ED Medications  iohexol (OMNIPAQUE) 300 MG/ML solution 125 mL (125 mLs Intravenous Contrast Given 04/30/23 2156)    ED Course/ Medical Decision Making/ A&P                                 Medical Decision Making Amount and/or Complexity of Data Reviewed Labs: ordered. Radiology: ordered.  Risk Prescription drug management.   This patient presents to the ED for concern of flank pain, this involves an extensive number of treatment options, and is a complaint that carries with it a high risk of complications and morbidity.   Differential diagnosis includes: radiculopathy, muscle strain, pyelonephritis, UTI, urolithiasis, nephrolithiasis, appendicitis, pancreatitis, IBS, IBD, etc.   Comorbidities  See HPI above   Additional History  Additional history obtained from previous records.   Lab Tests  I ordered and personally interpreted labs.  The pertinent results include:   CMP is unremarkable no acute AKI, transaminitis, or lactic abnormality. Lipase is unremarkable.   CBC is within normal limits no acute infection or anemia. UA is unremarkable.   Imaging Studies  I ordered imaging studies including Ct abdomen/pelvis  I independently visualized and interpreted imaging which showed: Focal fatty infiltration of the  liver, no evidence of ureteral stone or obstruction.  Mild aortic atherosclerosis. I agree with the radiologist interpretation   Problem List / ED Course / Critical Interventions / Medication Management  Right flank pain I ordered medications including: Lidocaine patch for pain given prior to discharge I have reviewed the patients home medicines and have made adjustments as needed   Social Determinants of Health  Tobacco use   Test / Admission - Considered  Discussed findings with patient and husband at bedside. She is hemodynamically stable and safe for discharge home. Return precautions provided.       Final Clinical Impression(s) / ED Diagnoses Final diagnoses:  Flank pain    Rx / DC Orders ED Discharge Orders     None         Maxwell Marion, PA-C 05/02/23 0038    Jacalyn Lefevre, MD 05/04/23 [id number]

## 2023-04-30 NOTE — ED Triage Notes (Addendum)
Rt flank pain xmonths she states  has  seen a dr had a CT scan this year sometime sh doesn't have a GB no n/v/d  she states  some sob esp when she takes a deep breath

## 2023-04-30 NOTE — Discharge Instructions (Addendum)
As discussed, your imaging shows fatty liver but no acute findings. Your labs don't show any acute findings that could be causing the pain.  For pain you can try lidocaine patches.  You can get them over-the-counter at any pharmacy.  Follow with your PCP in the next 3 to 5 days for reevaluation of your symptoms.  I have attached information for GI and orthopedics if needed to follow-up with either of them.  Please call the office to make an appointment if you do.  Please return to the ED if your symptoms worsen in the mean time.

## 2023-04-30 NOTE — ED Notes (Signed)
Patient transported to CT 

## 2023-05-01 DIAGNOSIS — H3552 Pigmentary retinal dystrophy: Secondary | ICD-10-CM | POA: Diagnosis not present

## 2023-05-01 LAB — HM DIABETES EYE EXAM

## 2023-05-07 ENCOUNTER — Telehealth: Payer: BC Managed Care – PPO | Admitting: Family Medicine

## 2023-05-27 ENCOUNTER — Encounter: Payer: Self-pay | Admitting: Family Medicine

## 2023-06-05 ENCOUNTER — Telehealth (INDEPENDENT_AMBULATORY_CARE_PROVIDER_SITE_OTHER): Payer: BC Managed Care – PPO | Admitting: Family Medicine

## 2023-06-05 ENCOUNTER — Encounter: Payer: Self-pay | Admitting: Family Medicine

## 2023-06-05 VITALS — BP 122/80 | Temp 100.2°F | Wt 216.0 lb

## 2023-06-05 DIAGNOSIS — K219 Gastro-esophageal reflux disease without esophagitis: Secondary | ICD-10-CM

## 2023-06-05 DIAGNOSIS — J069 Acute upper respiratory infection, unspecified: Secondary | ICD-10-CM

## 2023-06-05 DIAGNOSIS — L309 Dermatitis, unspecified: Secondary | ICD-10-CM | POA: Insufficient documentation

## 2023-06-05 DIAGNOSIS — E038 Other specified hypothyroidism: Secondary | ICD-10-CM

## 2023-06-05 DIAGNOSIS — E519 Thiamine deficiency, unspecified: Secondary | ICD-10-CM | POA: Diagnosis not present

## 2023-06-05 DIAGNOSIS — Z7689 Persons encountering health services in other specified circumstances: Secondary | ICD-10-CM

## 2023-06-05 MED ORDER — CLOBETASOL PROPIONATE 0.05 % EX CREA: TOPICAL_CREAM | Freq: Two times a day (BID) | CUTANEOUS | 1 refills | Status: AC | PRN

## 2023-06-05 MED ORDER — LEVOCETIRIZINE DIHYDROCHLORIDE 5 MG PO TABS: 5.0000 mg | ORAL_TABLET | Freq: Every evening | ORAL | 3 refills | Status: AC

## 2023-06-05 MED ORDER — TIRZEPATIDE 12.5 MG/0.5ML ~~LOC~~ SOAJ
12.5000 mg | SUBCUTANEOUS | 0 refills | Status: DC
Start: 2023-06-05 — End: 2023-08-06

## 2023-06-05 MED ORDER — LEVOTHYROXINE SODIUM 137 MCG PO TABS
ORAL_TABLET | ORAL | 0 refills | Status: DC
Start: 2023-06-05 — End: 2023-09-25

## 2023-06-05 MED ORDER — ESOMEPRAZOLE MAGNESIUM 40 MG PO CPDR: DELAYED_RELEASE_CAPSULE | ORAL | 1 refills | Status: AC

## 2023-06-05 NOTE — Progress Notes (Signed)
Established Patient Office Visit  Subjective   Patient ID: Desiree Jackson, female    DOB: [birth year]  Age: 42 y.o. MRN: [id number]  Chief Complaint  Patient presents with  . Sore Throat  . Medical Management of Chronic Issues    Medication refills  . Nasal Congestion  . Headache    Pt reports that she has had sxs for 2 days. Her son has been sick since Saturday. She has been taking IBU and sudafed for her sxs.    HPI  She has had a ST, nasal congestion, and HA x 2 days.  Her son has been sick starting this last weekend he tested Negative for COVID but she has not tested herself.  Follow-up hypothyroidism-last TSH was 0.294 months ago and we discussed rechecking it after making a slight change.  I recommended that she take half of a tab 1 day a week and a whole tab the other 6 days and repeat this pattern and then recheck.  Also in May her vitamin B1 was low and I recommended that she start taking a supplement.  She has not started a B1 supplement yet.  But she did adjust her thyroid medication and is doing really well.  Weight management-she is tolerating the medication well she has been on her current dose of 10 mg Mounjaro for about 3 months now but feels like she is starting to plateau she is really been sticking to her sugar-free diet and trying to avoid high-fat foods she has been trying to order solids when she goes out to eat.  She has been walking about 2 miles a day 5 days a week and then trying to do Zumba once or twice a week on top of that.  He also wanted to let me know that she did take herself off of her Dupixent she did not really like the side effects but is almost out of her clobetasol and wanted to know if she can get a refill today.     ROS    Objective:     BP 122/80   Temp 100.2 F (37.9 C) (Oral)   Wt 216 lb (98 kg)   LMP 03/06/2013   BMI 39.51 kg/m    Physical Exam Vitals reviewed.  Constitutional:      Appearance: Normal appearance.   HENT:     Head: Normocephalic.  Pulmonary:     Effort: Pulmonary effort is normal.  Neurological:     Mental Status: She is alert and oriented to person, place, and time.  Psychiatric:        Mood and Affect: Mood normal.        Behavior: Behavior normal.     No results found for any visits on 06/05/23.    The 10-year ASCVD risk score (Arnett DK, et al., 2019) is: 1.5%    Assessment & Plan:   Problem List Items Addressed This Visit       Digestive   GERD (gastroesophageal reflux disease)    Requested refill on her Nexium today.      Relevant Medications   esomeprazole (NEXIUM) 40 MG capsule     Endocrine   Hypothyroidism    Due to recheck TSH.  Especially with her continued weight loss we may need to continue to adjust her medication for a while.      Relevant Medications   levothyroxine (SYNTHROID) 137 MCG tablet   Other Relevant Orders   TSH     Musculoskeletal and  Integument   Eczema    She is no longer on the Dupixent she really did not like the side effects so came off on her own so she does need a refill on her clobetasol cream.      Relevant Medications   clobetasol cream (TEMOVATE) 0.05 %     Other   Encounter for weight management    Visit #: 4 Starting Weight: 282 lbs   Current weight: 216 lbs Previous weight: 240 lbs  Change in weight: Down 24 lbs   Goal weight: Under 200 lbs.   Dietary goals: She has primarily gone sugar-free, eating more salads Exercise goals: Can 2 miles 5 days/week and doing Zumba once or twice a week. Medication: Increase Mounjaro to 12.5 mg  Follow-up and referrals: 8 weeks.       Relevant Medications   tirzepatide (MOUNJARO) 12.5 MG/0.5ML Pen   Other Visit Diagnoses     Vitamin B1 deficiency    -  Primary   Relevant Orders   Vitamin B1   Viral upper respiratory tract infection          Upper respiratory infection-most likely viral call if getting worse or not improving after the weekend.  Continue with  conservative care.  Return in about 2 months (around 08/05/2023) for weight mgt .    Nani Gasser, MD

## 2023-06-05 NOTE — Assessment & Plan Note (Signed)
Due to recheck TSH.  Especially with her continued weight loss we may need to continue to adjust her medication for a while.

## 2023-06-05 NOTE — Assessment & Plan Note (Addendum)
Visit #: 4 Starting Weight: 282 lbs   Current weight: 216 lbs Previous weight: 240 lbs  Change in weight: Down 24 lbs   Goal weight: Under 200 lbs.   Dietary goals: She has primarily gone sugar-free, eating more salads Exercise goals: Can 2 miles 5 days/week and doing Zumba once or twice a week. Medication: Increase Mounjaro to 12.5 mg  Follow-up and referrals: 8 weeks.

## 2023-06-05 NOTE — Assessment & Plan Note (Signed)
Requested refill on her Nexium today.

## 2023-06-05 NOTE — Assessment & Plan Note (Signed)
She is no longer on the Dupixent she really did not like the side effects so came off on her own so she does need a refill on her clobetasol cream.

## 2023-06-06 ENCOUNTER — Encounter: Payer: Self-pay | Admitting: Family Medicine

## 2023-06-06 MED ORDER — AZITHROMYCIN 250 MG PO TABS: ORAL_TABLET | ORAL | 0 refills | Status: AC

## 2023-06-06 NOTE — Telephone Encounter (Signed)
Meds ordered this encounter  ?Medications  ? azithromycin (ZITHROMAX) 250 MG tablet  ?  Sig: 2 Ttabs PO on Day 1, then one a day x 4 days.  ?  Dispense:  6 tablet  ?  Refill:  0  ? ? ?

## 2023-06-22 ENCOUNTER — Other Ambulatory Visit: Payer: Self-pay | Admitting: Medical Genetics

## 2023-06-22 DIAGNOSIS — Z006 Encounter for examination for normal comparison and control in clinical research program: Secondary | ICD-10-CM

## 2023-07-03 ENCOUNTER — Other Ambulatory Visit: Payer: Self-pay | Admitting: Family Medicine

## 2023-07-03 DIAGNOSIS — Z1231 Encounter for screening mammogram for malignant neoplasm of breast: Secondary | ICD-10-CM

## 2023-07-12 ENCOUNTER — Other Ambulatory Visit (HOSPITAL_COMMUNITY): Payer: Self-pay | Attending: Medical Genetics

## 2023-08-02 DIAGNOSIS — Z1231 Encounter for screening mammogram for malignant neoplasm of breast: Secondary | ICD-10-CM

## 2023-08-05 ENCOUNTER — Other Ambulatory Visit: Payer: Self-pay | Admitting: Family Medicine

## 2023-08-05 DIAGNOSIS — Z7689 Persons encountering health services in other specified circumstances: Secondary | ICD-10-CM

## 2023-08-06 ENCOUNTER — Encounter: Payer: Self-pay | Admitting: Family Medicine

## 2023-08-06 MED ORDER — TIRZEPATIDE 15 MG/0.5ML ~~LOC~~ SOAJ: 15.0000 mg | SUBCUTANEOUS | 0 refills | Status: DC

## 2023-08-06 NOTE — Telephone Encounter (Signed)
Meds ordered this encounter  Medications   DISCONTD: tirzepatide (MOUNJARO) 15 MG/0.5ML Pen    Sig: Inject 15 mg into the skin once a week.    Dispense:  6 mL    Refill:  0   tirzepatide (MOUNJARO) 15 MG/0.5ML Pen    Sig: Inject 15 mg into the skin once a week.    Dispense:  6 mL    Refill:  0

## 2023-08-14 DIAGNOSIS — H3552 Pigmentary retinal dystrophy: Secondary | ICD-10-CM | POA: Diagnosis not present

## 2023-08-14 DIAGNOSIS — H40053 Ocular hypertension, bilateral: Secondary | ICD-10-CM | POA: Diagnosis not present

## 2023-08-23 DIAGNOSIS — H40053 Ocular hypertension, bilateral: Secondary | ICD-10-CM | POA: Diagnosis not present

## 2023-09-13 DIAGNOSIS — H40033 Anatomical narrow angle, bilateral: Secondary | ICD-10-CM | POA: Diagnosis not present

## 2023-09-20 DIAGNOSIS — H40033 Anatomical narrow angle, bilateral: Secondary | ICD-10-CM | POA: Diagnosis not present

## 2023-09-25 ENCOUNTER — Other Ambulatory Visit: Payer: Self-pay | Admitting: *Deleted

## 2023-09-25 DIAGNOSIS — E038 Other specified hypothyroidism: Secondary | ICD-10-CM

## 2023-09-25 MED ORDER — LEVOTHYROXINE SODIUM 137 MCG PO TABS: ORAL_TABLET | ORAL | 0 refills | Status: DC

## 2023-10-09 ENCOUNTER — Encounter: Payer: Self-pay | Admitting: Emergency Medicine

## 2023-10-09 ENCOUNTER — Ambulatory Visit: Payer: Self-pay | Admitting: Family Medicine

## 2023-10-09 ENCOUNTER — Ambulatory Visit
Admission: EM | Admit: 2023-10-09 | Discharge: 2023-10-09 | Disposition: A | Discharge status: Home / Self Care | Payer: BC Managed Care – PPO | Attending: Physician Assistant | Admitting: Physician Assistant

## 2023-10-09 ENCOUNTER — Ambulatory Visit (INDEPENDENT_AMBULATORY_CARE_PROVIDER_SITE_OTHER): Payer: BC Managed Care – PPO

## 2023-10-09 DIAGNOSIS — S20211A Contusion of right front wall of thorax, initial encounter: Secondary | ICD-10-CM

## 2023-10-09 DIAGNOSIS — S299XXA Unspecified injury of thorax, initial encounter: Secondary | ICD-10-CM

## 2023-10-09 DIAGNOSIS — W010XXA Fall on same level from slipping, tripping and stumbling without subsequent striking against object, initial encounter: Secondary | ICD-10-CM

## 2023-10-09 NOTE — Discharge Instructions (Addendum)
Return if any problems.

## 2023-10-09 NOTE — Telephone Encounter (Signed)
  Chief Complaint: right sided chest pain Symptoms: right sided tightness chest pain radiates to right arm, "little SOB", "slight dizziness" Frequency: x 3 days, worsens with movement Pertinent Negatives: Patient's husband denies nausea, vomiting Disposition: [x] ED /[] Urgent Care (no appt availability in office) / [] Appointment(In office/virtual)/ []  [facility]/ [] Home Care/ [x] Refused Recommended Disposition /[] [facility]/ []  Follow-up with PCP Additional Notes: Triage assessment limited due to patient's husband on phone. He states she is hard of hearing and is homeschooling their son, so he calls in for triage. Husband states patient will not go to ED. Called [facility] and staff aware.  Copied from CRM [id number]. Topic: Clinical - Red Word Triage >> Oct 09, 2023  1:26 PM Nila Nephew wrote: Red Word that prompted transfer to Nurse Triage: Patient's spouse calling in to schedule an appointment for chest and arm pain. Reason for Disposition  SEVERE chest pain  Answer Assessment - Initial Assessment Questions 1. LOCATION: "Where does it hurt?"       Right sided chest pain "seems to be getting worse".  2. RADIATION: "Does the pain go anywhere else?" (e.g., into neck, jaw, arms, back)     Right arm, and whole upper right side of body as well.  3. ONSET: "When did the chest pain begin?" (Minutes, hours or days)      X 3 days.  4. PATTERN: "Does the pain come and go, or has it been constant since it started?"  "Does it get worse with exertion?"      "Been getting worse". Comes and goes with movement.  5. DURATION: "How long does it last" (e.g., seconds, minutes, hours)     Slight pain all the time and worsens with movement.  6. SEVERITY: "How bad is the pain?"  (e.g., Scale 1-10; mild, moderate, or severe)    - MILD (1-3): doesn't interfere with normal activities     - MODERATE (4-7): interferes with normal activities or awakens from sleep    - SEVERE (8-10): excruciating  pain, unable to do any normal activities       Tightness; 8-9/10 when moving.  7. CARDIAC RISK FACTORS: "Do you have any history of heart problems or risk factors for heart disease?" (e.g., angina, prior heart attack; diabetes, high blood pressure, high cholesterol, smoker, or strong family history of heart disease)     Husband states not sure about cholesterol and "probably" about high blood pressure.   8. PULMONARY RISK FACTORS: "Do you have any history of lung disease?"  (e.g., blood clots in lung, asthma, emphysema, birth control pills)     Denies.  9. CAUSE: "What do you think is causing the chest pain?"     Husband states she thinks it is related to her muscles, she took muscle relaxer medication and did not improve.  10. OTHER SYMPTOMS: "Do you have any other symptoms?" (e.g., dizziness, nausea, vomiting, sweating, fever, difficulty breathing, cough)       "Little SOB", slight dizziness.  11. PREGNANCY: "Is there any chance you are pregnant?" "When was your last menstrual period?"       Husband states she has had hysterectomy.  Protocols used: Chest Pain-A-AH

## 2023-10-09 NOTE — Telephone Encounter (Signed)
Okay to schedule for an acute today or tomorrow since she is refusing to go to the ED.

## 2023-10-09 NOTE — ED Triage Notes (Signed)
Patient c/o right sided flank pain for several days.  Patient did recently fall on that side so not sure if it's related.  Denies SOB only discomfort when she takes a deep breath.  Patient has taken Ibuprofen.
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
## 2023-10-10 NOTE — Telephone Encounter (Signed)
Left a message for patient to call back to schedule an appointment.

## 2023-10-11 ENCOUNTER — Encounter: Payer: Self-pay | Admitting: Family Medicine

## 2023-10-11 ENCOUNTER — Ambulatory Visit (INDEPENDENT_AMBULATORY_CARE_PROVIDER_SITE_OTHER): Payer: BC Managed Care – PPO | Admitting: Family Medicine

## 2023-10-11 VITALS — BP 148/80 | HR 61 | Ht 62.0 in | Wt 208.0 lb

## 2023-10-11 DIAGNOSIS — R0781 Pleurodynia: Secondary | ICD-10-CM

## 2023-10-11 DIAGNOSIS — E038 Other specified hypothyroidism: Secondary | ICD-10-CM | POA: Diagnosis not present

## 2023-10-11 DIAGNOSIS — E519 Thiamine deficiency, unspecified: Secondary | ICD-10-CM | POA: Diagnosis not present

## 2023-10-11 MED ORDER — TRAMADOL HCL 50 MG PO TABS
50.0000 mg | ORAL_TABLET | Freq: Three times a day (TID) | ORAL | 0 refills | Status: AC | PRN
Start: 2023-10-11 — End: 2023-10-16

## 2023-10-11 NOTE — Progress Notes (Signed)
   Acute Office Visit  Subjective:     Patient ID: Desiree Jackson, female    DOB: 02-Jun-1981, 43 y.o.   MRN: [id number]  Chief Complaint  Patient presents with   Chest Pain    R sided x 1 week Reports that it hurts to take a deep breath or if she moves it feels like a sharp pain.if she's sitting its more like pressure she stated that she did fall but is not sure if the pain is related to this.     HPI Patient is in today for   R sided x 1 week Reports that it hurts to take a deep breath or if she moves it feels like a sharp pain.if she's sitting its more like pressure she stated that she did fall but is not sure if the pain is related to this.    Taking 800 mg IBU, pain is a little better today but hard to sleep.   ROS      Objective:    BP (!) 148/80   Pulse 61   Ht 5\' 2"  (1.575 m)   Wt 208 lb (94.3 kg)   LMP 03/06/2013   SpO2 100%   BMI 38.04 kg/m    Physical Exam Vitals and nursing note reviewed.  Constitutional:      Appearance: Normal appearance.  HENT:     Head: Normocephalic and atraumatic.  Eyes:     Conjunctiva/sclera: Conjunctivae normal.  Cardiovascular:     Rate and Rhythm: Normal rate and regular rhythm.  Pulmonary:     Effort: Pulmonary effort is normal.     Breath sounds: Normal breath sounds.  Musculoskeletal:     Comments: Very tender on right side below axilla.  No bruising.   Skin:    General: Skin is warm and dry.  Neurological:     Mental Status: She is alert.  Psychiatric:        Mood and Affect: Mood normal.     No results found for any visits on 10/11/23.      Assessment & Plan:   Problem List Items Addressed This Visit       Endocrine   Hypothyroidism   Relevant Orders   TSH   Other Visit Diagnoses       Rib pain on right side    -  Primary     Vitamin B1 deficiency       Relevant Orders   Vitamin B1      Right Rib Pain - Taking Ibu 800 mg TID with overlapping Tylenol. Given tramadol for extra pain  relief.  Can use heat or ice. Call if not improving over next 4-6 weeks. Likely bone contusion.  Xray reassuring no fracture.    Lungs clear on exam today.      Meds ordered this encounter  Medications   traMADol (ULTRAM) 50 MG tablet    Sig: Take 1-2 tablets (50-100 mg total) by mouth every 8 (eight) hours as needed for up to 5 days.    Dispense:  20 tablet    Refill:  0    No follow-ups on file.  Nani Gasser, MD

## 2023-10-14 ENCOUNTER — Other Ambulatory Visit: Payer: Self-pay | Admitting: *Deleted

## 2023-10-14 ENCOUNTER — Other Ambulatory Visit: Payer: Self-pay | Admitting: Family Medicine

## 2023-10-14 ENCOUNTER — Encounter: Payer: Self-pay | Admitting: Family Medicine

## 2023-10-14 DIAGNOSIS — E038 Other specified hypothyroidism: Secondary | ICD-10-CM

## 2023-10-14 MED ORDER — LEVOTHYROXINE SODIUM 125 MCG PO TABS: 125.0000 ug | ORAL_TABLET | Freq: Every day | ORAL | 1 refills | Status: AC

## 2023-10-14 NOTE — Progress Notes (Signed)
 Hi Peytyn, we need to make a slight adjustment to your thyroid medication.  I believe you are taking a whole tab every day.  So I am going to adjust your levothyroxine 225 mcg daily.  New prescription sent to [facility].  Plan to recheck your levels again in about 8 weeks.

## 2023-10-17 LAB — TSH: TSH: 0.128 u[IU]/mL — ABNORMAL LOW (ref 0.450–4.500)

## 2023-10-17 LAB — VITAMIN B1: Thiamine: 201.6 nmol/L — ABNORMAL HIGH (ref 66.5–200.0)

## 2023-10-17 NOTE — Progress Notes (Signed)
 Your Vit B1 looks good.

## 2023-10-18 DIAGNOSIS — H40033 Anatomical narrow angle, bilateral: Secondary | ICD-10-CM | POA: Diagnosis not present

## 2023-10-18 DIAGNOSIS — H40053 Ocular hypertension, bilateral: Secondary | ICD-10-CM | POA: Diagnosis not present

## 2023-10-22 ENCOUNTER — Ambulatory Visit (INDEPENDENT_AMBULATORY_CARE_PROVIDER_SITE_OTHER): Payer: BC Managed Care – PPO | Admitting: Family Medicine

## 2023-10-22 ENCOUNTER — Encounter: Payer: Self-pay | Admitting: Family Medicine

## 2023-10-22 VITALS — BP 122/81 | HR 71 | Ht 61.02 in | Wt 204.6 lb

## 2023-10-22 DIAGNOSIS — Z7689 Persons encountering health services in other specified circumstances: Secondary | ICD-10-CM

## 2023-10-22 DIAGNOSIS — E538 Deficiency of other specified B group vitamins: Secondary | ICD-10-CM | POA: Diagnosis not present

## 2023-10-22 DIAGNOSIS — Z0000 Encounter for general adult medical examination without abnormal findings: Secondary | ICD-10-CM | POA: Diagnosis not present

## 2023-10-22 DIAGNOSIS — Z8249 Family history of ischemic heart disease and other diseases of the circulatory system: Secondary | ICD-10-CM | POA: Diagnosis not present

## 2023-10-22 NOTE — Assessment & Plan Note (Signed)
 Visit #: 4 Starting Weight: 282 lbs   Current weight: 216 lbs Previous weight: 240 lbs  Change in weight: Down 24 lbs   Goal weight: Under 200 lbs.   Dietary goals: She has primarily gone sugar-free, eating more salads Exercise goals: Can 2 miles 5 days/week and doing Zumba once or twice a week. Medication: Increase Mounjaro to 12.5 mg  Follow-up and referrals: 8 weeks.

## 2023-10-22 NOTE — Progress Notes (Signed)
 Complete physical exam  Patient: Desiree Jackson   DOB: 1980-12-03   43 y.o. Female  MRN: [id number]  Subjective:    Chief Complaint  Patient presents with   Annual Exam    Desiree Jackson is a 43 y.o. female who presents today for a complete physical exam. She reports consuming a general diet.  Walking about 3 miles per day.  She has lost some weight.   She generally feels well. She reports sleeping fairly well. She does not have additional problems to discuss today.  Her brother who is 4 years older just had triple bypass surgery.   Most recent fall risk assessment:    10/11/2023   11:01 AM  Fall Risk   Falls in the past year? 1  Number falls in past yr: 0  Injury with Fall? 0  Risk for fall due to : Other (Comment)  Follow up Falls evaluation completed     Most recent depression screenings:    10/11/2023   11:02 AM 06/05/2023   10:47 AM  PHQ 2/9 Scores  PHQ - 2 Score 0 0        Patient Care Team: Agapito Games, MD as PCP - General (Family Medicine)   Outpatient Medications Prior to Visit  Medication Sig   clobetasol cream (TEMOVATE) 0.05 % Apply topically 2 (two) times daily as needed.   esomeprazole (NEXIUM) 40 MG capsule TAKE 1 CAPSULE BY MOUTH TWICE DAILY BEFORE A MEAL   levocetirizine (XYZAL) 5 MG tablet Take 1 tablet (5 mg total) by mouth every evening.   levothyroxine (SYNTHROID) 125 MCG tablet Take 1 tablet (125 mcg total) by mouth daily before breakfast.   tirzepatide (MOUNJARO) 15 MG/0.5ML Pen Inject 15 mg into the skin once a week.   No facility-administered medications prior to visit.    ROS        Objective:     BP 122/81 (BP Location: Left Arm, Patient Position: Sitting, Cuff Size: Normal)   Pulse 71   Ht 5' 1.02" (1.55 m)   Wt 204 lb 9.6 oz (92.8 kg)   LMP 03/06/2013   SpO2 100%   BMI 38.63 kg/m    Physical Exam Vitals and nursing note reviewed.  Constitutional:      Appearance: Normal appearance.  HENT:      Head: Normocephalic and atraumatic.     Right Ear: Tympanic membrane, ear canal and external ear normal.     Left Ear: Tympanic membrane, ear canal and external ear normal.     Nose: Nose normal.     Mouth/Throat:     Pharynx: Oropharynx is clear.  Eyes:     Extraocular Movements: Extraocular movements intact.     Conjunctiva/sclera: Conjunctivae normal.     Pupils: Pupils are equal, round, and reactive to light.  Neck:     Thyroid: No thyromegaly.  Cardiovascular:     Rate and Rhythm: Normal rate and regular rhythm.  Pulmonary:     Effort: Pulmonary effort is normal.     Breath sounds: Normal breath sounds.  Abdominal:     General: Bowel sounds are normal.     Palpations: Abdomen is soft.     Tenderness: There is no abdominal tenderness.  Musculoskeletal:        General: No swelling.     Cervical back: Neck supple.  Skin:    General: Skin is warm and dry.     Capillary Refill: Capillary refill takes less than 2 seconds.  Neurological:     Mental Status: She is alert and oriented to person, place, and time.  Psychiatric:        Mood and Affect: Mood normal.        Behavior: Behavior normal.      No results found for any visits on 10/22/23.     Assessment & Plan:    Routine Health Maintenance and Physical Exam  Immunization History  Administered Date(s) Administered   Influenza Inj Mdck Quad Pf 05/30/2018   Influenza Split 06/26/2011, 06/26/2012, 05/26/2020   Influenza,inj,Quad PF,6+ Mos 06/09/2013, 04/27/2015   Influenza,inj,Quad PF,6-35 Mos 05/27/2016   Influenza,inj,quad, With Preservative 05/07/2019   PFIZER(Purple Top)SARS-COV-2 Vaccination 10/31/2019, 11/24/2019   Td 04/19/2006    Health Maintenance  Topic Date Due   DTaP/Tdap/Td (2 - Tdap) 04/19/2016   MAMMOGRAM  06/05/2023   INFLUENZA VACCINE  11/25/2023 (Originally 03/28/2023)   Hepatitis C Screening  12/26/2023 (Originally 06/05/1997)   COVID-19 Vaccine (3 - Pfizer risk series) 01/11/2024  (Originally 12/22/2019)   HIV Screening  Completed   HPV VACCINES  Aged Out    Discussed health benefits of physical activity, and encouraged her to engage in regular exercise appropriate for her age and condition.  Problem List Items Addressed This Visit       Other   Encounter for weight management   Visit #: 4 Starting Weight: 282 lbs   Current weight: 216 lbs Previous weight: 240 lbs  Change in weight: Down 24 lbs   Goal weight: Under 200 lbs.   Dietary goals: She has primarily gone sugar-free, eating more salads Exercise goals: Can 2 miles 5 days/week and doing Zumba once or twice a week. Medication: Increase Mounjaro to 12.5 mg  Follow-up and referrals: 8 weeks.      Other Visit Diagnoses       Wellness examination    -  Primary   Relevant Orders   CMP14+EGFR   Lipid Panel With LDL/HDL Ratio   CBC with Differential/Platelet   Vitamin B1     B12 deficiency       Relevant Orders   CMP14+EGFR   Lipid Panel With LDL/HDL Ratio   CBC with Differential/Platelet   Vitamin B1     Family history of heart disease       Relevant Orders   CT CARDIAC SCORING (SELF PAY ONLY)       Keep up a regular exercise program and make sure you are eating a healthy diet  Your vaccines are up to date.  Courage her to schedule her mammogram. Also encouraged her to consider getting a CT cardiac score. Get up-to-date lab work today.  No follow-ups on file.     Nani Gasser, MD

## 2023-10-22 NOTE — Progress Notes (Signed)
 Pt states Mammogram has been scheduled. Declined any vaccines today.

## 2023-10-23 ENCOUNTER — Ambulatory Visit: Payer: BC Managed Care – PPO

## 2023-10-23 ENCOUNTER — Encounter: Payer: Self-pay | Admitting: Family Medicine

## 2023-10-23 ENCOUNTER — Ambulatory Visit (INDEPENDENT_AMBULATORY_CARE_PROVIDER_SITE_OTHER): Payer: Self-pay

## 2023-10-23 DIAGNOSIS — Z8249 Family history of ischemic heart disease and other diseases of the circulatory system: Secondary | ICD-10-CM

## 2023-10-23 DIAGNOSIS — Z1231 Encounter for screening mammogram for malignant neoplasm of breast: Secondary | ICD-10-CM

## 2023-10-23 NOTE — Progress Notes (Signed)
 Hi Lusero, total cholesterol and LDL are mildly elevated just continue to work on [employer] and regular exercise.  Metabolic panel looks great.  Blood count looks good this time.  Hemoglobin is normal.  I did order a vitamin B1 as well but it still pending.

## 2023-10-24 ENCOUNTER — Encounter: Payer: Self-pay | Admitting: Family Medicine

## 2023-10-25 ENCOUNTER — Encounter: Payer: Self-pay | Admitting: Family Medicine

## 2023-10-25 NOTE — Progress Notes (Signed)
 Please call patient. Normal mammogram.  Repeat in 1 year.

## 2023-10-25 NOTE — Progress Notes (Signed)
 Hi Desiree Jackson, great news!  Your coronary calcium score was 0 meaning that you are at very low risk and there is no significant plaque buildup in the coronaries.  That is absolutely wonderful news.  They also just took a look at the surrounding structures and did not see anything concerning.

## 2023-10-28 LAB — CMP14+EGFR
ALT: 24 IU/L (ref 0–32)
AST: 17 IU/L (ref 0–40)
Albumin: 4.1 g/dL (ref 3.9–4.9)
Alkaline Phosphatase: 67 IU/L (ref 44–121)
BUN/Creatinine Ratio: 15 (ref 9–23)
BUN: 11 mg/dL (ref 6–24)
Bilirubin Total: 0.6 mg/dL (ref 0.0–1.2)
CO2: 26 mmol/L (ref 20–29)
Calcium: 9.9 mg/dL (ref 8.7–10.2)
Chloride: 104 mmol/L (ref 96–106)
Creatinine, Ser: 0.73 mg/dL (ref 0.57–1.00)
Globulin, Total: 2.1 g/dL (ref 1.5–4.5)
Glucose: 79 mg/dL (ref 70–99)
Potassium: 4.6 mmol/L (ref 3.5–5.2)
Sodium: 143 mmol/L (ref 134–144)
Total Protein: 6.2 g/dL (ref 6.0–8.5)
eGFR: 104 mL/min/{1.73_m2} (ref >59)

## 2023-10-28 LAB — CBC WITH DIFFERENTIAL/PLATELET
Basophils Absolute: 0 10*3/uL (ref 0.0–0.2)
Basos: 1 %
EOS (ABSOLUTE): 0.2 10*3/uL (ref 0.0–0.4)
Eos: 2 %
Hematocrit: 44 % (ref 34.0–46.6)
Hemoglobin: 14.2 g/dL (ref 11.1–15.9)
Immature Grans (Abs): 0 10*3/uL (ref 0.0–0.1)
Immature Granulocytes: 0 %
Lymphocytes Absolute: 2.1 10*3/uL (ref 0.7–3.1)
Lymphs: 31 %
MCH: 28.7 pg (ref 26.6–33.0)
MCHC: 32.3 g/dL (ref 31.5–35.7)
MCV: 89 fL (ref 79–97)
Monocytes Absolute: 0.4 10*3/uL (ref 0.1–0.9)
Monocytes: 5 %
Neutrophils Absolute: 3.9 10*3/uL (ref 1.4–7.0)
Neutrophils: 61 %
Platelets: 372 10*3/uL (ref 150–450)
RBC: 4.94 x10E6/uL (ref 3.77–5.28)
RDW: 13.7 % (ref 11.7–15.4)
WBC: 6.6 10*3/uL (ref 3.4–10.8)

## 2023-10-28 LAB — LIPID PANEL WITH LDL/HDL RATIO
Cholesterol, Total: 203 mg/dL — ABNORMAL HIGH (ref 100–199)
HDL: 47 mg/dL (ref >39)
LDL Chol Calc (NIH): 136 mg/dL — ABNORMAL HIGH (ref 0–99)
LDL/HDL Ratio: 2.9 ratio (ref 0.0–3.2)
Triglycerides: 109 mg/dL (ref 0–149)
VLDL Cholesterol Cal: 20 mg/dL (ref 5–40)

## 2023-10-28 LAB — VITAMIN B1: Thiamine: 133.7 nmol/L (ref 66.5–200.0)

## 2023-10-28 NOTE — Progress Notes (Signed)
 B1 looks great this time.

## 2023-10-31 ENCOUNTER — Other Ambulatory Visit: Payer: Self-pay | Admitting: Family Medicine

## 2023-11-12 ENCOUNTER — Other Ambulatory Visit: Payer: Self-pay | Admitting: Family Medicine

## 2023-11-16 ENCOUNTER — Encounter: Payer: Self-pay | Admitting: Family Medicine

## 2023-11-18 ENCOUNTER — Telehealth: Payer: Self-pay

## 2023-11-18 NOTE — Telephone Encounter (Unsigned)
 Copied from CRM [id number]. Topic: Clinical - Prescription Issue >> Nov 18, 2023 10:09 AM Macon Large wrote: Reason for CRM: Patient husband called for an update on the prior authorization for [employer]. He requests call back at [phone]

## 2023-11-19 ENCOUNTER — Other Ambulatory Visit (HOSPITAL_COMMUNITY): Payer: Self-pay

## 2023-11-19 ENCOUNTER — Ambulatory Visit: Admitting: Family Medicine

## 2023-11-19 ENCOUNTER — Telehealth: Payer: Self-pay

## 2023-11-19 ENCOUNTER — Telehealth: Payer: Self-pay | Admitting: *Deleted

## 2023-11-19 ENCOUNTER — Encounter: Payer: Self-pay | Admitting: Family Medicine

## 2023-11-19 DIAGNOSIS — G4733 Obstructive sleep apnea (adult) (pediatric): Secondary | ICD-10-CM

## 2023-11-19 NOTE — Telephone Encounter (Signed)
 I received a request to do a prior auth for Mounjaro 15MG /0.5ML auto-injectors, unfortunately this drug is only covered for patients with diabetes. I do not see on her chart where she has been diagnosed with diabetes. Please consider changing therapies.

## 2023-11-19 NOTE — Telephone Encounter (Signed)
 Copied from CRM [id number]. Topic: Clinical - Prescription Issue >> Nov 18, 2023 10:09 AM Macon Large wrote: Reason for CRM: Patient husband called for an update on the prior authorization for [facility]. He requests call back at [phone] >> Nov 19, 2023 10:43 AM Emylou G wrote: Pls call spouse [name] back asap.. [phone].Marland Kitchen adv wife has been receiving mounjaro for the past 6 months.. he is insisting this is covered.Marland Kitchen

## 2023-11-19 NOTE — Telephone Encounter (Signed)
 Hi,  This is a continuation of her therapy for this medication. She has IFG and OSA. So can you not use either of this Dx's for approval?

## 2023-11-19 NOTE — Telephone Encounter (Signed)
 Hi Desiree Jackson,  This patient has been trying to obtain her [facility] prescription for sometime. It looks like on 3/7 and 3/19 we were sent PA's for this and there was no follow up. She has not changed her insurance.  Can you please look into this for her ASAP. This was forwarded to our person who was doing the PA's at the time however it obviously wasn't handled.

## 2023-11-19 NOTE — Telephone Encounter (Signed)
 Request sent to the Rx Prior Auth Team.

## 2023-11-20 ENCOUNTER — Other Ambulatory Visit (HOSPITAL_COMMUNITY): Payer: Self-pay

## 2023-11-20 MED ORDER — WEGOVY 2.4 MG/0.75ML ~~LOC~~ SOAJ: 2.4000 mg | SUBCUTANEOUS | 3 refills | Status: AC

## 2023-11-20 MED ORDER — ZEPBOUND 15 MG/0.5ML ~~LOC~~ SOAJ: 15.0000 mg | SUBCUTANEOUS | 3 refills | Status: AC

## 2023-11-20 NOTE — Addendum Note (Signed)
 Addended by: Nani Gasser D on: 11/20/2023 05:35 PM   Modules accepted: Orders

## 2023-11-20 NOTE — Telephone Encounter (Signed)
 Sent to pharmacy for response.

## 2023-11-20 NOTE — Telephone Encounter (Signed)
 Meds ordered this encounter  Medications   tirzepatide (ZEPBOUND) 15 MG/0.5ML Pen    Sig: Inject 15 mg into the skin once a week.    Dispense:  6 mL    Refill:  3    IFG and OSA

## 2023-11-20 NOTE — Telephone Encounter (Signed)
 Called pt and LVM  informing her that Dr. Linford Arnold had to change her medication to Zepbound because her insurance will not cover for her current Dx. Also sending my chart message to inform her of this.

## 2023-11-20 NOTE — Addendum Note (Signed)
 Addended by: Nani Gasser D on: 11/20/2023 12:46 PM   Modules accepted: Orders

## 2023-11-20 NOTE — Telephone Encounter (Signed)
 FYI - per covermymeds, a prior auth for Greggory Keen was already initiated online Marylene Land).

## 2023-11-20 NOTE — Telephone Encounter (Signed)
 Unfortunately Greggory Keen is not approved for IFG or OSA. Zepbound is approved for OSA. Would you consider changing to that and I can then do the prior auth?

## 2023-11-20 NOTE — Telephone Encounter (Signed)
 A notification from pharmacy that they might be able to get the [facility] approved.  Meds ordered this encounter  Medications   tirzepatide (ZEPBOUND) 15 MG/0.5ML Pen    Sig: Inject 15 mg into the skin once a week.    Dispense:  6 mL    Refill:  3    IFG and OSA   Semaglutide-Weight Management (WEGOVY) 2.4 MG/0.75ML SOAJ    Sig: Inject 2.4 mg into the skin every 7 (seven) days.    Dispense:  3 mL    Refill:  3

## 2023-11-21 ENCOUNTER — Other Ambulatory Visit (HOSPITAL_COMMUNITY): Payer: Self-pay

## 2023-11-21 ENCOUNTER — Telehealth: Payer: Self-pay

## 2023-11-21 ENCOUNTER — Encounter: Payer: Self-pay | Admitting: Family Medicine

## 2023-11-21 DIAGNOSIS — K760 Fatty (change of) liver, not elsewhere classified: Secondary | ICD-10-CM

## 2023-11-21 NOTE — Telephone Encounter (Signed)
 Desiree Jackson -   Pharmacy Patient Advocate Encounter   Received notification from  Patient  that prior authorization for [facility] is required/requested.   Insurance verification completed.   The patient is insured through  [employer] (claims/PA's handled by [employer])  .   Per test claim: PA required, called to submit PA because CMM wasn't allowing duplicate submission.   Received denial from pharmacist, asked that denial letter be sent to provider's office to be shared with the patient as unfortunately, nothing else can be done due to her not having diabetes diagnosis. Zepbound and Reginal Lutes are not covered by [employer].

## 2023-11-21 NOTE — Telephone Encounter (Signed)
 Would she be open to compounded formulation? It wuld be out of pocket but cheaper than these

## 2023-11-21 NOTE — Telephone Encounter (Signed)
 Pharmacy Patient Advocate Encounter   Received notification from  Patient  that prior authorization for [facility] is required/requested.   Insurance verification completed.   The patient is insured through  [employer] (claims/PA's handled by [employer])  .   Per test claim: PA required, called to submit PA because CMM wasn't allowing duplicate submission.  Received denial from pharmacist, asked that denial letter be sent to provider's office to be shared with the patient as unfortunately, nothing else can be done due to her not having diabetes diagnosis. Zepbound and Reginal Lutes are not covered by [employer].

## 2023-11-21 NOTE — Telephone Encounter (Signed)
 Denial received and indexed to pt media

## 2023-11-22 ENCOUNTER — Encounter: Payer: Self-pay | Admitting: Family Medicine

## 2023-11-22 ENCOUNTER — Telehealth: Payer: Self-pay | Admitting: Family Medicine

## 2023-11-22 DIAGNOSIS — G4733 Obstructive sleep apnea (adult) (pediatric): Secondary | ICD-10-CM

## 2023-11-22 DIAGNOSIS — K760 Fatty (change of) liver, not elsewhere classified: Secondary | ICD-10-CM | POA: Insufficient documentation

## 2023-11-25 ENCOUNTER — Other Ambulatory Visit (HOSPITAL_COMMUNITY): Payer: Self-pay

## 2023-11-25 ENCOUNTER — Telehealth: Payer: Self-pay

## 2023-11-25 NOTE — Telephone Encounter (Signed)
 Pharmacy Patient Advocate Encounter  Received notification from [facility] that Prior Authorization for Zepbound has been DENIED.  See denial reason below. No denial letter attached in CMM. Will attach denial letter to Media tab once received.

## 2023-11-26 NOTE — Telephone Encounter (Signed)
error 

## 2023-11-27 NOTE — Telephone Encounter (Signed)
 Hi Angela, were are we at with everything.

## 2023-12-06 DIAGNOSIS — H40033 Anatomical narrow angle, bilateral: Secondary | ICD-10-CM | POA: Diagnosis not present

## 2023-12-19 ENCOUNTER — Ambulatory Visit: Payer: BC Managed Care – PPO | Admitting: Family Medicine

## 2023-12-23 NOTE — Telephone Encounter (Signed)
 Call and see if she head anytihnig from insurance. I wrote a letter of appeal  but we haven't heaed back either.

## 2023-12-25 NOTE — Telephone Encounter (Signed)
 Attempted call to patient. Left a voice mail message requesting a return call.

## 2023-12-27 NOTE — Telephone Encounter (Signed)
 Attempted call to patient. Left a voice mail message requesting a return call.

## 2024-01-01 NOTE — Telephone Encounter (Signed)
 Spoke with [facility]- states Mounjaro  15mg  shows an approval of PA # [id number] approved from May 23 ,2025- December 16, 2024.  States shows a paid claim from April 22nd where patient had pick up script at [facility] - states if script is changed to 90 day supply will also be covered as long as 90/d supply is noted on prescription. - states because there was an approval on file the appeal was not needed and thus was denied.

## 2024-01-01 NOTE — Telephone Encounter (Signed)
 It was already sent for 90 day back in March

## 2024-01-02 ENCOUNTER — Telehealth: Payer: Self-pay

## 2024-01-02 NOTE — Transitions of Care (Post Inpatient/ED Visit) (Signed)
   01/02/2024  Name: Desiree Jackson MRN: [id number] DOB: 01-21-1981  Today's TOC FU Call Status: Today's TOC FU Call Status:: Unsuccessful Call (1st Attempt) Unsuccessful Call (1st Attempt) Date: 01/02/24  Attempted to reach the patient regarding the most recent Inpatient/ED visit.  Follow Up Plan: Additional outreach attempts will be made to reach the patient to complete the Transitions of Care (Post Inpatient/ED visit) call.   Signature Darrall Ellison, LPN [facility] Nurse Health Advisor Direct Dial [phone]

## 2024-01-03 NOTE — Transitions of Care (Post Inpatient/ED Visit) (Signed)
   01/03/2024  Name: Desiree Jackson MRN: [id number] DOB: 05-May-1981  Today's TOC FU Call Status: Today's TOC FU Call Status:: Successful TOC FU Call Completed Unsuccessful Call (1st Attempt) Date: 01/02/24 [facility] FU Call Complete Date: 01/03/24 Patient's Name and Date of Birth confirmed.  Transition Care Management Follow-up Telephone Call Discharge Facility:  (Has not been in hospital in over a year)  Items Reviewed:    Medications Reviewed Today: Medications Reviewed Today   Medications were not reviewed in this encounter     Home Care and Equipment/Supplies:    Functional Questionnaire:    Follow up appointments reviewed: PCP Follow-up appointment confirmed?: No (transferred to new PCP)    SIGNATURE Darrall Ellison, LPN [facility] Nurse Health Advisor Direct Dial [phone]

## 2024-05-27 DIAGNOSIS — R5383 Other fatigue: Secondary | ICD-10-CM | POA: Diagnosis not present

## 2024-05-27 DIAGNOSIS — F419 Anxiety disorder, unspecified: Secondary | ICD-10-CM | POA: Diagnosis not present

## 2024-05-27 DIAGNOSIS — E890 Postprocedural hypothyroidism: Secondary | ICD-10-CM | POA: Diagnosis not present

## 2024-05-27 DIAGNOSIS — R4586 Emotional lability: Secondary | ICD-10-CM | POA: Diagnosis not present

## 2024-06-17 DIAGNOSIS — N6315 Unspecified lump in the right breast, overlapping quadrants: Secondary | ICD-10-CM | POA: Diagnosis not present

## 2024-07-07 ENCOUNTER — Other Ambulatory Visit: Payer: Self-pay | Admitting: Nurse Practitioner

## 2024-07-07 DIAGNOSIS — N6315 Unspecified lump in the right breast, overlapping quadrants: Secondary | ICD-10-CM

## 2024-07-15 ENCOUNTER — Inpatient Hospital Stay: Admission: RE | Admit: 2024-07-15 | Source: Ambulatory Visit

## 2024-07-15 ENCOUNTER — Other Ambulatory Visit: Payer: Self-pay | Admitting: Nurse Practitioner

## 2024-07-15 ENCOUNTER — Ambulatory Visit
Admission: RE | Admit: 2024-07-15 | Discharge: 2024-07-15 | Disposition: A | Discharge status: Home / Self Care | Source: Ambulatory Visit | Attending: Nurse Practitioner | Admitting: Nurse Practitioner

## 2024-07-15 ENCOUNTER — Other Ambulatory Visit

## 2024-07-15 ENCOUNTER — Inpatient Hospital Stay
Admission: RE | Admit: 2024-07-15 | Discharge: 2024-07-15 | Attending: Nurse Practitioner | Admitting: Nurse Practitioner

## 2024-07-15 ENCOUNTER — Ambulatory Visit: Admission: RE | Admit: 2024-07-15 | Source: Ambulatory Visit

## 2024-07-15 DIAGNOSIS — N6311 Unspecified lump in the right breast, upper outer quadrant: Secondary | ICD-10-CM | POA: Diagnosis not present

## 2024-07-15 DIAGNOSIS — R928 Other abnormal and inconclusive findings on diagnostic imaging of breast: Secondary | ICD-10-CM | POA: Diagnosis not present

## 2024-07-15 DIAGNOSIS — N6315 Unspecified lump in the right breast, overlapping quadrants: Secondary | ICD-10-CM

## 2024-07-15 DIAGNOSIS — N6320 Unspecified lump in the left breast, unspecified quadrant: Secondary | ICD-10-CM
